# Patient Record
Sex: Female | Born: 1980 | Race: Black or African American | Hispanic: No | Marital: Single | Smoking: Never smoker
Health system: Southern US, Community
[De-identification: ages and names within clinical notes are randomized; demographics above are authoritative.]

## PROBLEM LIST (undated history)

## (undated) DIAGNOSIS — F32A Depression, unspecified: Secondary | ICD-10-CM

## (undated) DIAGNOSIS — F329 Major depressive disorder, single episode, unspecified: Secondary | ICD-10-CM

## (undated) DIAGNOSIS — F419 Anxiety disorder, unspecified: Secondary | ICD-10-CM

## (undated) HISTORY — DX: Depression, unspecified: F32.A

## (undated) HISTORY — DX: Anxiety disorder, unspecified: F41.9

## (undated) HISTORY — DX: Major depressive disorder, single episode, unspecified: F32.9

## (undated) HISTORY — PX: NO PAST SURGERIES: SHX2092

---

## 2011-01-28 ENCOUNTER — Encounter: Admit: 2011-01-28 | Payer: Self-pay | Admitting: Family Medicine

## 2014-05-09 ENCOUNTER — Ambulatory Visit (INDEPENDENT_AMBULATORY_CARE_PROVIDER_SITE_OTHER): Payer: BC Managed Care – PPO | Admitting: Physician Assistant

## 2014-05-09 VITALS — BP 124/78 | HR 81 | Temp 98.6°F | Resp 18 | Ht 65.0 in | Wt 238.0 lb

## 2014-05-09 DIAGNOSIS — B3731 Acute candidiasis of vulva and vagina: Secondary | ICD-10-CM

## 2014-05-09 DIAGNOSIS — B373 Candidiasis of vulva and vagina: Secondary | ICD-10-CM

## 2014-05-09 DIAGNOSIS — N926 Irregular menstruation, unspecified: Secondary | ICD-10-CM

## 2014-05-09 DIAGNOSIS — B9689 Other specified bacterial agents as the cause of diseases classified elsewhere: Secondary | ICD-10-CM

## 2014-05-09 DIAGNOSIS — F32A Depression, unspecified: Secondary | ICD-10-CM | POA: Insufficient documentation

## 2014-05-09 DIAGNOSIS — N898 Other specified noninflammatory disorders of vagina: Secondary | ICD-10-CM

## 2014-05-09 DIAGNOSIS — N925 Other specified irregular menstruation: Secondary | ICD-10-CM

## 2014-05-09 DIAGNOSIS — N949 Unspecified condition associated with female genital organs and menstrual cycle: Secondary | ICD-10-CM

## 2014-05-09 DIAGNOSIS — N760 Acute vaginitis: Secondary | ICD-10-CM

## 2014-05-09 DIAGNOSIS — F329 Major depressive disorder, single episode, unspecified: Secondary | ICD-10-CM | POA: Insufficient documentation

## 2014-05-09 DIAGNOSIS — L293 Anogenital pruritus, unspecified: Secondary | ICD-10-CM

## 2014-05-09 LAB — POCT URINALYSIS DIPSTICK
Bilirubin, UA: NEGATIVE
GLUCOSE UA: NEGATIVE
Ketones, UA: NEGATIVE
NITRITE UA: NEGATIVE
RBC UA: NEGATIVE
Spec Grav, UA: 1.015
UROBILINOGEN UA: 1
pH, UA: 7.5

## 2014-05-09 LAB — POCT WET PREP WITH KOH
Clue Cells Wet Prep HPF POC: 75
KOH Prep POC: POSITIVE
TRICHOMONAS UA: NEGATIVE
Yeast Wet Prep HPF POC: NEGATIVE

## 2014-05-09 LAB — POCT UA - MICROSCOPIC ONLY
CRYSTALS, UR, HPF, POC: NEGATIVE
Casts, Ur, LPF, POC: NEGATIVE
Yeast, UA: NEGATIVE

## 2014-05-09 LAB — POCT URINE PREGNANCY: Preg Test, Ur: NEGATIVE

## 2014-05-09 MED ORDER — METRONIDAZOLE 500 MG PO TABS: 500.0000 mg | ORAL_TABLET | Freq: Two times a day (BID) | ORAL | Status: AC

## 2014-05-09 MED ORDER — FLUCONAZOLE 150 MG PO TABS: 150.0000 mg | ORAL_TABLET | Freq: Once | ORAL | Status: DC

## 2014-05-09 NOTE — Progress Notes (Signed)
Subjective:    Patient ID: [name] [name], female    DOB: Jul 13, 1981, 33 y.o.   MRN: [id number]  HPI Pt presents to clinic with vaginal irritation and discharge. She is late on her menses but that is normal for her.  She is sexually active without condom use - she does not want pregnancy but is ok if it happens.  She is not concerned about STIs but would like to be tested while she is here.  She is having no urinary symptoms.  Review of Systems  Constitutional: Negative for fever and chills.  Genitourinary: Positive for vaginal discharge. Negative for dysuria, urgency and frequency.       Objective:   Physical Exam  Vitals reviewed. Constitutional: She is oriented to person, place, and time. She appears well-developed and well-nourished.  HENT:  Head: Normocephalic and atraumatic.  Right Ear: External ear normal.  Left Ear: External ear normal.  Neck: Normal range of motion.  Cardiovascular: Normal rate, regular rhythm and normal heart sounds.   No murmur heard. Pulmonary/Chest: Effort normal and breath sounds normal. She has no wheezes.  Abdominal: Soft. There is no tenderness.  Genitourinary: Uterus normal. Pelvic exam was performed with patient supine. There is no rash, tenderness, lesion or injury on the right labia. There is no rash, tenderness, lesion or injury on the left labia. Cervix exhibits no motion tenderness, no discharge and no friability. Right adnexum displays no mass, no tenderness and no fullness. Left adnexum displays no mass, no tenderness and no fullness. No erythema around the vagina. Vaginal discharge (thin white and clumpy white discharge) found.  Neurological: She is alert and oriented to person, place, and time.  Skin: Skin is warm and dry.  Psychiatric: She has a normal mood and affect. Her behavior is normal. Judgment and thought content normal.   Results for orders placed in visit on 05/09/14  POCT URINALYSIS DIPSTICK      Result Value Ref Range   Color, UA yellow     Clarity, UA slightly cloudy     Glucose, UA neg     Bilirubin, UA neg     Ketones, UA neg     Spec Grav, UA 1.015     Blood, UA neg     pH, UA 7.5     Protein, UA trace     Urobilinogen, UA 1.0     Nitrite, UA neg     Leukocytes, UA Trace    POCT UA - MICROSCOPIC ONLY      Result Value Ref Range   WBC, Ur, HPF, POC 1-2     RBC, urine, microscopic 0-2     Bacteria, U Microscopic 2+     Mucus, UA trace     Epithelial cells, urine per micros 4-11     Crystals, Ur, HPF, POC neg     Casts, Ur, LPF, POC neg     Yeast, UA neg    POCT WET PREP WITH KOH      Result Value Ref Range   Trichomonas, UA Negative     Clue Cells Wet Prep HPF POC 75%     Epithelial Wet Prep HPF POC 15-25     Yeast Wet Prep HPF POC neg     Bacteria Wet Prep HPF POC 1+     RBC Wet Prep HPF POC 0-1     WBC Wet Prep HPF POC 0-3     KOH Prep POC Positive    POCT URINE PREGNANCY  Result Value Ref Range   Preg Test, Ur Negative         Assessment & Plan:  Vaginal itching - Plan: POCT urinalysis dipstick, POCT UA - Microscopic Only, POCT Wet Prep with KOH, GC/Chlamydia Probe Amp  Late menses - Plan: POCT urine pregnancy  BV (bacterial vaginosis) - Plan: metroNIDAZOLE (FLAGYL) 500 MG tablet  Yeast vaginitis - Plan: fluconazole (DIFLUCAN) 150 MG tablet  Benny LennertSarah Weber PA-C  [facility] 05/09/2014 9:29 PM

## 2014-05-11 LAB — GC/CHLAMYDIA PROBE AMP
CT PROBE, AMP APTIMA: NEGATIVE
GC Probe RNA: NEGATIVE

## 2014-05-25 ENCOUNTER — Telehealth: Payer: Self-pay

## 2014-05-25 ENCOUNTER — Telehealth: Payer: Self-pay | Admitting: Physician Assistant

## 2014-05-25 DIAGNOSIS — B3731 Acute candidiasis of vulva and vagina: Secondary | ICD-10-CM

## 2014-05-25 DIAGNOSIS — B373 Candidiasis of vulva and vagina: Secondary | ICD-10-CM

## 2014-05-25 MED ORDER — FLUCONAZOLE 150 MG PO TABS: 150.0000 mg | ORAL_TABLET | Freq: Once | ORAL | Status: AC

## 2014-05-25 NOTE — Telephone Encounter (Signed)
I sent in a medication called Diflucan - that is most likely what is causing the itchng - if she is not better after this she needs to RTC.

## 2014-05-25 NOTE — Telephone Encounter (Signed)
This patient called the answering service last night about 11 pm and spoke with Dr. Cleta Alberts regarding recurrent vaginal itching.  Symptoms were present x 10 days.  Seen about 2 months ago and diagnosed with yeast and BV.  Dr. Cleta Alberts advised the patient that he would look in her record and contact her today.  There is no patient in our system with this name and DOB.  I contacted the answering service to verify the name spelling and DOB. I called the number Dr. Cleta Alberts called last night and left a message to return our call.

## 2014-05-25 NOTE — Telephone Encounter (Signed)
[name] Sago, do you want to give pt another round of medication or does she need to come back in for testing? It looks like you Rxd her Flagyl also.

## 2014-05-25 NOTE — Telephone Encounter (Signed)
Pt was here on the 12 th of this month and saw Benny Lennert for a yeast infection. She was prescribed fluconazole and valACYclovir, the infection seemed to go away, but is now back; she would like to be advised whether or not she should come back in for a visit or can be re-priscribed these medications. Please call  [phone]

## 2014-05-25 NOTE — Telephone Encounter (Signed)
Pt advised.

## 2014-05-26 NOTE — Telephone Encounter (Signed)
It appears that the name was incorrectly spelled. The patient was advised to RTC for re-evaluation earlier on the same day she called the answering service.

## 2014-07-31 LAB — OB RESULTS CONSOLE ABO/RH: RH TYPE: POSITIVE

## 2014-07-31 LAB — OB RESULTS CONSOLE RUBELLA ANTIBODY, IGM: RUBELLA: IMMUNE

## 2014-07-31 LAB — OB RESULTS CONSOLE HEPATITIS B SURFACE ANTIGEN: Hepatitis B Surface Ag: NEGATIVE

## 2014-07-31 LAB — OB RESULTS CONSOLE ANTIBODY SCREEN: Antibody Screen: NEGATIVE

## 2014-07-31 LAB — OB RESULTS CONSOLE GC/CHLAMYDIA
CHLAMYDIA, DNA PROBE: NEGATIVE
Gonorrhea: NEGATIVE

## 2014-07-31 LAB — OB RESULTS CONSOLE RPR: RPR: NONREACTIVE

## 2014-07-31 LAB — OB RESULTS CONSOLE HIV ANTIBODY (ROUTINE TESTING): HIV: NONREACTIVE

## 2014-08-01 ENCOUNTER — Other Ambulatory Visit: Payer: Self-pay | Admitting: Obstetrics and Gynecology

## 2014-08-01 ENCOUNTER — Other Ambulatory Visit (HOSPITAL_COMMUNITY)
Admission: RE | Admit: 2014-08-01 | Discharge: 2014-08-01 | Disposition: A | Discharge status: Home / Self Care | Payer: BC Managed Care – PPO | Source: Ambulatory Visit | Attending: Obstetrics and Gynecology | Admitting: Obstetrics and Gynecology

## 2014-08-01 DIAGNOSIS — Z1389 Encounter for screening for other disorder: Secondary | ICD-10-CM

## 2014-08-01 DIAGNOSIS — Z1151 Encounter for screening for human papillomavirus (HPV): Secondary | ICD-10-CM | POA: Insufficient documentation

## 2014-08-01 DIAGNOSIS — Z01419 Encounter for gynecological examination (general) (routine) without abnormal findings: Secondary | ICD-10-CM | POA: Insufficient documentation

## 2014-08-03 ENCOUNTER — Ambulatory Visit (HOSPITAL_COMMUNITY)
Admission: RE | Admit: 2014-08-03 | Discharge: 2014-08-03 | Disposition: A | Discharge status: Home / Self Care | Payer: BC Managed Care – PPO | Source: Ambulatory Visit | Attending: Obstetrics and Gynecology | Admitting: Obstetrics and Gynecology

## 2014-08-03 DIAGNOSIS — Z363 Encounter for antenatal screening for malformations: Secondary | ICD-10-CM | POA: Insufficient documentation

## 2014-08-03 DIAGNOSIS — Z1389 Encounter for screening for other disorder: Secondary | ICD-10-CM | POA: Insufficient documentation

## 2014-08-03 LAB — CYTOLOGY - PAP

## 2014-08-04 ENCOUNTER — Ambulatory Visit (HOSPITAL_COMMUNITY): Payer: Self-pay

## 2014-09-01 ENCOUNTER — Ambulatory Visit (INDEPENDENT_AMBULATORY_CARE_PROVIDER_SITE_OTHER): Payer: BC Managed Care – PPO | Admitting: Internal Medicine

## 2014-09-01 ENCOUNTER — Ambulatory Visit: Payer: BC Managed Care – PPO | Admitting: Internal Medicine

## 2014-09-01 ENCOUNTER — Encounter: Payer: Self-pay | Admitting: Internal Medicine

## 2014-09-01 VITALS — BP 116/62 | HR 85 | Temp 98.6°F | Ht 66.0 in | Wt 232.0 lb

## 2014-09-01 DIAGNOSIS — R7989 Other specified abnormal findings of blood chemistry: Secondary | ICD-10-CM | POA: Insufficient documentation

## 2014-09-01 DIAGNOSIS — R799 Abnormal finding of blood chemistry, unspecified: Secondary | ICD-10-CM

## 2014-09-01 NOTE — Progress Notes (Signed)
Patient ID: [name] [name], female   DOB: 18-Mar-1981, 33 y.o.   MRN: [id number]  HPI: [name] [name] is a 33 y.o. female, referred by Dr Dion Body, in consultation for high testosterone level. She is now [redacted] weeks pregnant.  Pt was referred after she presented to her ObGyn for pregnancy management. A total testosterone was high, 111 (8-48) at 15 weeks of her pregnancy. Of note, free testosterone was normal, at 1.6 (0-4.2).   The tesosterone was checked as she described had hirsutism and her cycles were irregular (every 2-3 months) - she initially did not realize she was pregnant 2/2 her previous irregular cycles.   She was also told she had an enlarged clitoris by Dr Dion Body at last visit. She tells me she has had this all her life - no recent changes. She had PAP smears in the past by PCP.   Weight gain: - her weight fluctuates w/in 10 lbs - no steroid use  Fertility/Menstrual cycles: - irregular menses  - no h/o ovarian cysts - menarche at 7 y/o - children: 0 - miscarriages: 0 - contraception: was on OCP in college >> came off late 69s >> cycles became irregular  Acne: - no  Hirsutism: - chin, upper lip, chest, stomach  - more lately  Other meds: - SSRIs: not during the pregnancy  - Last thyroid tests: normal  - during pregnancy - Last HbA1c: normal - during pregnancy  She has FH of infertility or PCOS. FH orf DM in grandparents.  ROS: Constitutional: + weight gain, + fatigue, no subjective hyperthermia/hypothermia, + poor sleep, + nocturia Eyes: no blurry vision, no xerophthalmia ENT: no sore throat, no nodules palpated in throat, no dysphagia/odynophagia, no hoarseness Cardiovascular: no CP/SOB/palpitations/leg swelling Respiratory: no cough/SOB Gastrointestinal: no N/V/D/+ C Musculoskeletal: no muscle/joint aches Skin: no acne, + hair on face, + acanthosis nigricans neck; + new thin stretch marks, + excessive hair growth (see HPI), + eczema Neurological: no  tremors/numbness/tingling/dizziness, + HA Psychiatric: no depression/anxiety  Past Medical History  Diagnosis Date  . Depression   . Anxiety    No past surgical history on file. History   Social History  . Marital Status: Single    Spouse Name: N/A    Number of Children: 0   Occupational History  . teacher   Social History Main Topics  . Smoking status: Never Smoker   . Smokeless tobacco: Never Used  . Alcohol Use: 0.5 - 1.0 oz/week    1-2 drink(s) per week  . Drug Use: No   Current Outpatient Prescriptions on File Prior to Visit  Medication Sig Dispense Refill  . valACYclovir (VALTREX) 500 MG tablet Take 500 mg by mouth daily as needed.      Marland Kitchen escitalopram (LEXAPRO) 20 MG tablet Take 20 mg by mouth daily.      . fluconazole (DIFLUCAN) 150 MG tablet Take 1 tablet (150 mg total) by mouth once. Repeat in 1 week is needed  2 tablet  0   No current facility-administered medications on file prior to visit.   No Known Allergies Family History  Problem Relation Age of Onset  . Hypertension Mother   . Hypertension Father   . Diabetes Maternal Grandmother   . Heart failure Maternal Grandmother   . Cancer Paternal Grandfather    PE: BP 116/62  Pulse 85  Temp(Src) 98.6 F (37 C) (Oral)  Ht  (1.676 m)  Wt 232 lb (105.235 kg)  BMI 37.46 kg/m2  SpO2 98% Wt Readings from  Last 3 Encounters:  09/01/14 232 lb (105.235 kg)  05/09/14 238 lb (107.956 kg)   Constitutional: overweight, in NAD, no full supraclavicular fat pads Eyes: PERRLA, EOMI, no exophthalmos ENT: moist mucous membranes, no thyromegaly, no cervical lymphadenopathy Cardiovascular: RRR, No MRG Respiratory: CTA B Gastrointestinal: abdomen soft, NT, ND, BS+ Musculoskeletal: no deformities, strength intact in all 4 Skin: moist, warm; no acne, + dark terminal hair on chin, + dark vellum on sideburns, + acanthosis nigricans, no purple, wide, stretch marks Neurological: no tremor with outstretched hands, DTR  normal in all 4  ASSESSMENT: 1. Elevated testosterone level  2. Irregular menses - prior to pregnancy  PLAN: 1. Elevated testosterone level - we discussed about the fact that her total testosterone was elevated while her free testosterone was normal >> this is not uncommon in pregnancy due to the increase SHBG production from the liver, stimulated by the increased estrogen >> the increased SHBG will bind more testosterone >> the testosterone production needs to be increased to maintain the free testosterone in the normal range.  - we will recheck today:   Total testosterone  Free testosterone  SHBG level  2. Irregular menses prior to pregnancy It is possible that pt had PCOS or [place]-CAH (non classical cortical adrenal hyperplasia) prior to pregnancy - but I explained that we cannot check hormonal levels now - she will need to return for this ~ 6 mo after her pregnancy (ideally not during b'feeding, also)  I had a long discussion with the patient about the fact that the PCOS is of sum of several conditions, including:  weight gain  insulin resistance (and therefore a higher risk of developing diabetes later in life)  acne  hirsutism  irregular menstrual cycles  decreased fertility. - We also discussed about the fact that the treatment is usually targeted to addressing the problem that concerns the patient the most: acne/hirsutism, weight gain, or fertility, but there is no single treatment for PCOS.  - The first-line therapy are oral contraceptives. If she is concerned with her weight, we can use metformin; if she is concerned about acne/hirsutism, we can add spironolactone; and if she is concerned about fertility, I could refer her to reproductive endocrinology for possible use of clomiphene.  We also discussed that if she turns out to have [place]-CAH, this is usually managed like PCOS (with the exception of fertility management). We cannot check 17 hydroxy progesterone during  pregnancy to investigate her for this condition, but we can do this when she comes back after the pregnancy. A high teststosterone level, an enlarged clitoris and hirsutism can be seen in this condition, too.  - time spent with the patient: 1 hour, of which >50% was spent in obtaining information about her symptoms/signs, reviewing her previous labs, evaluations, counseling her about her condition (please see the discussed topics above), and developing a plan to further investigate it;  she had a number of questions which I addressed.   Office Visit on 09/01/2014  Component Date Value Ref Range Status  . Testosterone 09/01/2014 134* 10 - 70 ng/dL Final   Comment:           Michiel Sites       Female              Female  I              < 30 ng/dL        < 10 ng/dL                                        II             < 150 ng/dL       < 30 ng/dL                                        III            100-320 ng/dL     < 35 ng/dL                                        IV             200-970 ng/dL     19-14 ng/dL                                        V/Adult        300-890 ng/dL     78-29 ng/dL                             . Sex Hormone Binding 09/01/2014 162* 18 - 114 nmol/L Final  . Testosterone, Free 09/01/2014 7.4* 0.6 - 6.8 pg/mL Final   Comment:                            The concentration of free testosterone is derived from a mathematical                          expression based on constants for the binding of testosterone to sex                          hormone-binding globulin and albumin.  . Testosterone-% Free 09/01/2014 0.6  0.4 - 2.4 % Final   The SHBG is high >> a very high total testosterone, however, the free testosterone is only a little high, not worrisome, which is consistent with a prior dx of PCOS (most likely, however, we will further investigate this after the pregnancy, as mentioned above).

## 2014-09-01 NOTE — Patient Instructions (Signed)
Please stop at the lab downstairs [facility] lab). Please come back ~ 6 months after the pregnancy for further checking.  Polycystic Ovarian Syndrome Polycystic ovarian syndrome (PCOS) is a common hormonal disorder among women of reproductive age. Most women with PCOS grow many small cysts on their ovaries. PCOS can cause problems with your periods and make it difficult to get pregnant. It can also cause an increased risk of miscarriage with pregnancy. If left untreated, PCOS can lead to serious health problems, such as diabetes and heart disease. CAUSES The cause of PCOS is not fully understood, but genetics may be a factor. SIGNS AND SYMPTOMS   Infrequent or no menstrual periods.   Inability to get pregnant (infertility) because of not ovulating.   Increased growth of hair on the face, chest, stomach, back, thumbs, thighs, or toes.   Acne, oily skin, or dandruff.   Pelvic pain.   Weight gain or obesity, usually carrying extra weight around the waist.   Type 2 diabetes.   High cholesterol.   High blood pressure.   Female-pattern baldness or thinning hair.   Patches of thickened and dark brown or black skin on the neck, arms, breasts, or thighs.   Tiny excess flaps of skin (skin tags) in the armpits or neck area.   Excessive snoring and having breathing stop at times while asleep (sleep apnea).   Deepening of the voice.   Gestational diabetes when pregnant.  DIAGNOSIS  There is no single test to diagnose PCOS.   Your health care provider will:   Take a medical history.   Perform a pelvic exam.   Have ultrasonography done.   Check your female and female hormone levels.   Measure glucose or sugar levels in the blood.   Do other blood tests.   If you are producing too many female hormones, your health care provider will make sure it is from PCOS. At the physical exam, your health care provider will want to evaluate the areas of increased hair  growth. Try to allow natural hair growth for a few days before the visit.   During a pelvic exam, the ovaries may be enlarged or swollen because of the increased number of small cysts. This can be seen more easily by using vaginal ultrasonography or screening to examine the ovaries and lining of the uterus (endometrium) for cysts. The uterine lining may become thicker if you have not been having a regular period.  TREATMENT  Because there is no cure for PCOS, it needs to be managed to prevent problems. Treatments are based on your symptoms. Treatment is also based on whether you want to have a baby or whether you need contraception.  Treatment may include:   Progesterone hormone to start a menstrual period.   Birth control pills to make you have regular menstrual periods.   Medicines to make you ovulate, if you want to get pregnant.   Medicines to control your insulin.   Medicine to control your blood pressure.   Medicine and diet to control your high cholesterol and triglycerides in your blood.  Medicine to reduce excessive hair growth.  Surgery, making small holes in the ovary, to decrease the amount of female hormone production. This is done through a long, lighted tube (laparoscope) placed into the pelvis through a tiny incision in the lower abdomen.  HOME CARE INSTRUCTIONS  Only take over-the-counter or prescription medicine as directed by your health care provider.  Pay attention to the foods you eat and your  activity levels. This can help reduce the effects of PCOS.  Keep your weight under control.  Eat foods that are low in carbohydrate and high in fiber.  Exercise regularly. SEEK MEDICAL CARE IF:  Your symptoms do not get better with medicine.  You have new symptoms. Document Released: 04/10/2005 Document Revised: 10/05/2013 Document Reviewed: 06/02/2013 [facility] Patient Information 2015 [place], [place]. This information is not intended to replace advice given  to you by your health care provider. Make sure you discuss any questions you have with your health care provider.

## 2014-09-05 LAB — TESTOSTERONE, FREE, TOTAL, SHBG
Sex Hormone Binding: 162 nmol/L — ABNORMAL HIGH (ref 18–114)
TESTOSTERONE FREE: 7.4 pg/mL — AB (ref 0.6–6.8)
Testosterone-% Free: 0.6 % (ref 0.4–2.4)
Testosterone: 134 ng/dL — ABNORMAL HIGH (ref 10–70)

## 2014-09-06 ENCOUNTER — Encounter: Payer: Self-pay | Admitting: *Deleted

## 2014-12-27 LAB — OB RESULTS CONSOLE GBS: STREP GROUP B AG: NEGATIVE

## 2015-01-25 ENCOUNTER — Encounter (HOSPITAL_COMMUNITY): Payer: Self-pay | Admitting: *Deleted

## 2015-01-25 ENCOUNTER — Inpatient Hospital Stay (HOSPITAL_COMMUNITY)
Admission: AD | Admit: 2015-01-25 | Discharge: 2015-01-30 | DRG: 765 | Disposition: A | Discharge status: Home / Self Care | Payer: BC Managed Care – PPO | Source: Ambulatory Visit | Attending: Obstetrics and Gynecology | Admitting: Obstetrics and Gynecology

## 2015-01-25 DIAGNOSIS — Z6841 Body Mass Index (BMI) 40.0 and over, adult: Secondary | ICD-10-CM

## 2015-01-25 DIAGNOSIS — O99344 Other mental disorders complicating childbirth: Secondary | ICD-10-CM | POA: Diagnosis present

## 2015-01-25 DIAGNOSIS — F329 Major depressive disorder, single episode, unspecified: Secondary | ICD-10-CM | POA: Diagnosis present

## 2015-01-25 DIAGNOSIS — O99214 Obesity complicating childbirth: Secondary | ICD-10-CM | POA: Diagnosis present

## 2015-01-25 DIAGNOSIS — O42113 Preterm premature rupture of membranes, onset of labor more than 24 hours following rupture, third trimester: Secondary | ICD-10-CM | POA: Diagnosis present

## 2015-01-25 DIAGNOSIS — Z3A40 40 weeks gestation of pregnancy: Secondary | ICD-10-CM | POA: Diagnosis present

## 2015-01-25 LAB — POCT FERN TEST: POCT FERN TEST: POSITIVE

## 2015-01-25 LAB — CBC
HEMATOCRIT: 35.9 % — AB (ref 36.0–46.0)
Hemoglobin: 12.4 g/dL (ref 12.0–15.0)
MCH: 32 pg (ref 26.0–34.0)
MCHC: 34.5 g/dL (ref 30.0–36.0)
MCV: 92.5 fL (ref 78.0–100.0)
PLATELETS: 169 10*3/uL (ref 150–400)
RBC: 3.88 MIL/uL (ref 3.87–5.11)
RDW: 13.9 % (ref 11.5–15.5)
WBC: 6.5 10*3/uL (ref 4.0–10.5)

## 2015-01-25 MED ORDER — OXYCODONE-ACETAMINOPHEN 5-325 MG PO TABS: 2.0000 | ORAL_TABLET | ORAL | Status: DC | PRN

## 2015-01-25 MED ORDER — TERBUTALINE SULFATE 1 MG/ML IJ SOLN: 0.2500 mg | Freq: Once | INTRAMUSCULAR | Status: AC | PRN

## 2015-01-25 MED ORDER — CITRIC ACID-SODIUM CITRATE 334-500 MG/5ML PO SOLN
30.0000 mL | ORAL | Status: DC | PRN
  Administered 2015-01-27: 30 mL via ORAL
  Filled 2015-01-25: qty 15

## 2015-01-25 MED ORDER — LIDOCAINE HCL (PF) 1 % IJ SOLN: 30.0000 mL | INTRAMUSCULAR | Status: DC | PRN

## 2015-01-25 MED ORDER — BUTORPHANOL TARTRATE 1 MG/ML IJ SOLN
1.0000 mg | INTRAMUSCULAR | Status: DC | PRN
  Administered 2015-01-26 (×2): 1 mg via INTRAVENOUS
  Filled 2015-01-25 (×2): qty 1

## 2015-01-25 MED ORDER — ACETAMINOPHEN 325 MG PO TABS: 650.0000 mg | ORAL_TABLET | ORAL | Status: DC | PRN

## 2015-01-25 MED ORDER — LACTATED RINGERS IV SOLN
500.0000 mL | INTRAVENOUS | Status: DC | PRN
Start: 2015-01-25 — End: 2015-01-27
  Administered 2015-01-26 – 2015-01-27 (×3): 500 mL via INTRAVENOUS

## 2015-01-25 MED ORDER — ONDANSETRON HCL 4 MG/2ML IJ SOLN: 4.0000 mg | Freq: Four times a day (QID) | INTRAMUSCULAR | Status: DC | PRN

## 2015-01-25 MED ORDER — OXYCODONE-ACETAMINOPHEN 5-325 MG PO TABS: 1.0000 | ORAL_TABLET | ORAL | Status: DC | PRN

## 2015-01-25 MED ORDER — OXYTOCIN BOLUS FROM INFUSION: 500.0000 mL | INTRAVENOUS | Status: DC

## 2015-01-25 MED ORDER — OXYTOCIN 40 UNITS IN LACTATED RINGERS INFUSION - SIMPLE MED
62.5000 mL/h | INTRAVENOUS | Status: DC
  Filled 2015-01-25: qty 1000

## 2015-01-25 MED ORDER — FLEET ENEMA 7-19 GM/118ML RE ENEM: 1.0000 | ENEMA | RECTAL | Status: DC | PRN

## 2015-01-25 MED ORDER — OXYTOCIN 40 UNITS IN LACTATED RINGERS INFUSION - SIMPLE MED
1.0000 m[IU]/min | INTRAVENOUS | Status: DC
  Administered 2015-01-26: 2 m[IU]/min via INTRAVENOUS

## 2015-01-25 MED ORDER — LACTATED RINGERS IV SOLN
INTRAVENOUS | Status: DC
  Administered 2015-01-25 – 2015-01-26 (×2): via INTRAVENOUS
  Administered 2015-01-26: 125 mL/h via INTRAVENOUS
  Administered 2015-01-27: 07:00:00 via INTRAVENOUS

## 2015-01-25 NOTE — MAU Note (Signed)
PT  SAYS SHE NOTICED  WETNESS  TODAY  AT  6, 8, 9 PM-    NO GUSH.   GETS  P[facility]  - WITH DR  VARNADO-  TOLD  TO COME  IN .  HAS HX  OF  HSV-  LAST OUTBREAK-- 10-2014.  DENIES  OUTBREAK NOW-  TAKING  VALTREX..   VE IN OFFICE  TODAY-   1CM,

## 2015-01-25 NOTE — MAU Provider Note (Signed)
S: [name] [name] is a 34 y.o. G1P0 at 6574w5d who presents today with leaking of fluid. She denies any VB. She confirms fetal movement. O: VSS, afebrile Abdomen: soft, non-tender, gravid External: no lesion Vagina: small amount of yellow transparent, thick (jelly-like) discharge on perineum.   More transparent thick discharge seen in vagina.    Sample collected to look for ferning.   Cervix: pink, smooth Uterus: AGA  Fern test: positive.    RN will report to attending MD

## 2015-01-26 ENCOUNTER — Inpatient Hospital Stay (HOSPITAL_COMMUNITY): Payer: BC Managed Care – PPO | Admitting: Anesthesiology

## 2015-01-26 ENCOUNTER — Encounter (HOSPITAL_COMMUNITY): Payer: Self-pay | Admitting: Anesthesiology

## 2015-01-26 LAB — TYPE AND SCREEN
ABO/RH(D): O POS
Antibody Screen: NEGATIVE

## 2015-01-26 LAB — ABO/RH: ABO/RH(D): O POS

## 2015-01-26 MED ORDER — LACTATED RINGERS IV SOLN
INTRAVENOUS | Status: DC
  Administered 2015-01-26: 150 mL via INTRAUTERINE
  Administered 2015-01-27 (×2): via INTRAUTERINE

## 2015-01-26 MED ORDER — MISOPROSTOL 25 MCG QUARTER TABLET
50.0000 ug | ORAL_TABLET | Freq: Once | ORAL | Status: AC
  Administered 2015-01-26: 50 ug via ORAL
  Filled 2015-01-26: qty 0.5

## 2015-01-26 MED ORDER — LIDOCAINE HCL (PF) 1 % IJ SOLN
INTRAMUSCULAR | Status: DC | PRN
  Administered 2015-01-26 (×2): 4 mL

## 2015-01-26 MED ORDER — EPHEDRINE 5 MG/ML INJ: 10.0000 mg | INTRAVENOUS | Status: DC | PRN

## 2015-01-26 MED ORDER — SODIUM CHLORIDE 0.9 % IV SOLN
3.0000 g | Freq: Four times a day (QID) | INTRAVENOUS | Status: DC
  Administered 2015-01-26 – 2015-01-27 (×4): 3 g via INTRAVENOUS
  Filled 2015-01-26 (×6): qty 3

## 2015-01-26 MED ORDER — FENTANYL 2.5 MCG/ML BUPIVACAINE 1/10 % EPIDURAL INFUSION (WH - ANES)
14.0000 mL/h | INTRAMUSCULAR | Status: DC | PRN
  Administered 2015-01-26 – 2015-01-27 (×3): 14 mL/h via EPIDURAL
  Filled 2015-01-26 (×3): qty 125

## 2015-01-26 MED ORDER — PHENYLEPHRINE 40 MCG/ML (10ML) SYRINGE FOR IV PUSH (FOR BLOOD PRESSURE SUPPORT)
80.0000 ug | PREFILLED_SYRINGE | INTRAVENOUS | Status: DC | PRN
  Filled 2015-01-26: qty 20

## 2015-01-26 MED ORDER — PHENYLEPHRINE 40 MCG/ML (10ML) SYRINGE FOR IV PUSH (FOR BLOOD PRESSURE SUPPORT)
80.0000 ug | PREFILLED_SYRINGE | INTRAVENOUS | Status: DC | PRN
Start: 2015-01-26 — End: 2015-01-27

## 2015-01-26 MED ORDER — DIPHENHYDRAMINE HCL 50 MG/ML IJ SOLN: 12.5000 mg | INTRAMUSCULAR | Status: DC | PRN

## 2015-01-26 MED ORDER — LACTATED RINGERS IV SOLN
500.0000 mL | Freq: Once | INTRAVENOUS | Status: AC
  Administered 2015-01-26: 500 mL via INTRAVENOUS

## 2015-01-26 MED ORDER — FENTANYL 2.5 MCG/ML BUPIVACAINE 1/10 % EPIDURAL INFUSION (WH - ANES)
INTRAMUSCULAR | Status: DC | PRN
  Administered 2015-01-26: 14 mL/h via EPIDURAL

## 2015-01-26 NOTE — Progress Notes (Signed)
[name] [name] is a 34 y.o. G1P0 at 3974w6d  admitted for rupture of membranes  Subjective: Per nurse patient with thick meconium and variable decels that have now resolved... Patient is comfortable with her epidural   Objective: BP 119/75 mmHg  Pulse 87  Temp(Src) 98 F (36.7 C) (Oral)  Resp 18  Ht 5\' 5"  (1.651 m)  Wt 112.038 kg (247 lb)  BMI 41.10 kg/m2  SpO2 100%  LMP 03/30/2014   Total I/O In: -  Out: 1200 [Urine:1200]  FHT:  FHR: 130 bpm, variability: moderate,  accelerations:  Present,  decelerations:  Absent UC:   regular, every 3 minutes  IUPC placed  SVE:   Dilation: 3.5 Effacement (%): 80 Station: -1 Exam by:: Dr Richardson Doppole  Labs: Lab Results  Component Value Date   WBC 6.5 01/25/2015   HGB 12.4 01/25/2015   HCT 35.9* 01/25/2015   MCV 92.5 01/25/2015   PLT 169 01/25/2015    Assessment / Plan: 40 wks and 6 days with prom Continue pitocin for augmentation of labor  Unasyn for prolonged rupture.  Amnioinfusion given variables and thick meconium    [name] Pequignot J. 01/26/2015, 4:10 PM

## 2015-01-26 NOTE — Anesthesia Procedure Notes (Addendum)
Epidural Patient location during procedure: OB Start time: 01/26/2015 7:43 AM  Staffing Anesthesiologist: Travelle Mcclimans A. Performed by: anesthesiologist   Preanesthetic Checklist Completed: patient identified, site marked, surgical consent, pre-op evaluation, timeout performed, IV checked, risks and benefits discussed and monitors and equipment checked  Epidural Patient position: sitting Prep: site prepped and draped and DuraPrep Patient monitoring: continuous pulse ox and blood pressure Approach: midline Location: L3-L4 Injection technique: LOR air  Needle:  Needle type: Tuohy  Needle gauge: 17 G Needle length: 9 cm and 9 Needle insertion depth: 9 cm Catheter type: closed end flexible Catheter size: 19 Gauge Catheter at skin depth: 14 cm Test dose: negative and Other  Assessment Events: blood not aspirated, injection not painful, no injection resistance, negative IV test and no paresthesia  Additional Notes Patient identified. Risks and benefits discussed including failed block, incomplete  Pain control, post dural puncture headache, nerve damage, paralysis, blood pressure Changes, nausea, vomiting, reactions to medications-both toxic and allergic and post Partum back pain. All questions were answered. Patient expressed understanding and wished to proceed. Sterile technique was used throughout procedure. Epidural site was Dressed with sterile barrier dressing. No paresthesias, signs of intravascular injection Or signs of intrathecal spread were encountered.  Patient was more comfortable after the epidural was dosed. Please see RN's note for documentation of vital signs and FHR which are stable.

## 2015-01-26 NOTE — Anesthesia Preprocedure Evaluation (Signed)
Anesthesia Evaluation  Patient identified by MRN, date of birth, ID band Patient awake    Reviewed: Allergy & Precautions, NPO status , Patient's Chart, lab work & pertinent test results  Airway Mallampati: III  TM Distance: >3 FB Neck ROM: Full    Dental no notable dental hx. (+) Teeth Intact   Pulmonary neg pulmonary ROS,  breath sounds clear to auscultation  Pulmonary exam normal       Cardiovascular negative cardio ROS  Rhythm:Regular Rate:Normal     Neuro/Psych PSYCHIATRIC DISORDERS Anxiety Depression negative neurological ROS     GI/Hepatic negative GI ROS, Neg liver ROS,   Endo/Other  Morbid obesity  Renal/GU negative Renal ROS     Musculoskeletal negative musculoskeletal ROS (+)   Abdominal (+) + obese,   Peds  Hematology negative hematology ROS (+)   Anesthesia Other Findings   Reproductive/Obstetrics (+) Pregnancy HSV                             Anesthesia Physical Anesthesia Plan  ASA: III  Anesthesia Plan: Epidural   Post-op Pain Management:    Induction:   Airway Management Planned: Natural Airway  Additional Equipment:   Intra-op Plan:   Post-operative Plan:   Informed Consent: I have reviewed the patients History and Physical, chart, labs and discussed the procedure including the risks, benefits and alternatives for the proposed anesthesia with the patient or authorized representative who has indicated his/her understanding and acceptance.     Plan Discussed with: Anesthesiologist  Anesthesia Plan Comments:         Anesthesia Quick Evaluation

## 2015-01-26 NOTE — Progress Notes (Signed)
SCDS placed.

## 2015-01-26 NOTE — Progress Notes (Signed)
Loucinda Christell ConstantMoore is a 34 y.o. G1P0 at 9785w6d  admitted for rupture of membranes  Subjective:  patient resting comfortably with epidural    Objective: BP 104/52 mmHg  Pulse 84  Temp(Src) 98.4 F (36.9 C) (Oral)  Resp 18  Ht 5\' 5"  (1.651 m)  Wt 112.038 kg (247 lb)  BMI 41.10 kg/m2  SpO2 100%  LMP 03/30/2014      FHT:  FHR: 130 bpm, variability: moderate,  accelerations:  Present,  decelerations:  Absent UC:   irregular, every 3-6 minutes SVE:   Dilation: 2 Effacement (%): 80 Station: -2 Exam by:: Khamil Lamica  Labs: Lab Results  Component Value Date   WBC 6.5 01/25/2015   HGB 12.4 01/25/2015   HCT 35.9* 01/25/2015   MCV 92.5 01/25/2015   PLT 169 01/25/2015    Assessment / Plan: 40 wks 6 days with prom.. continue pitocin...  Unasyn for prolonged rupture   Tevan Marian J. 01/26/2015, 1:55 PM

## 2015-01-26 NOTE — Progress Notes (Signed)
[name] [name] is a [age] y.o. G1P0 at 364w6d  admitted for rupture of membranes  Subjective: Patient just received an epidural as " my pain tolerance is low"  She no longer feels her contractions.  Continued leakage of clear fluid no bleeding  Last hsv outbreak in December    Objective: BP 123/69 mmHg  Pulse 89  Temp(Src) 98.2 F (36.8 C) (Oral)  Resp 18  Ht 5\' 5"  (1.651 m)  Wt 112.038 kg (247 lb)  BMI 41.10 kg/m2  SpO2 100%  LMP 03/30/2014      FHT:  FHR: 120 bpm, variability: moderate,  accelerations:  Present,  decelerations:  Absent and   UC:   Irregular pt currently on 14 mu of pitocin  SVE:   Visually 1 cm ,.. Digital exam deferred  Labs: Lab Results  Component Value Date   WBC 6.5 01/25/2015   HGB 12.4 01/25/2015   HCT 35.9* 01/25/2015   MCV 92.5 01/25/2015   PLT 169 01/25/2015    Assessment / Plan: 40 wks 6 days with SROM   Labor: Continue pitocin  Preeclampsia:  NA Fetal Wellbeing:  Category I Pain Control:  Epidural I/D:  plan unasyn 18 hours after rupture  Anticipated MOD:  NSVD  Meldon Hanzlik J. 01/26/2015, 8:02 AM

## 2015-01-26 NOTE — H&P (Signed)
[name] [name] is a 34 y.o. female G1 at 78 6/7 weeks dated by a 12 week ultrasound presenting for c/o LOF since 1800. Pt seen in the office ~ yesterday morning.  Pt reported contractions started overnight.  No LOF or VB at that time.  Cervical exam at ~11 am was 1/60/-2, previously closed.  NST reactive and irregular contractions.  Pt was comfortable. Overnight pt reported LOF that was minimal but it persisted.  Upon arrival, Cervix was 1/80, perineum dry.  Fern positive by speculum exam.  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  SSE negative in MAU.  Pt denies prodromal symptoms.  PNC since 16 weeks with [facility] [name] [name]) complicated by elevated testosterone.  Clitoromegaly on initial exam.  Testosterone was slightly elevated.  Endocrinology has evaluated pt and no intervention recommended.  Maternal Medical History:  Reason for admission: Rupture of membranes.   Contractions: Onset was 13-24 hours ago.   Frequency: irregular.   Perceived severity is mild.    Fetal activity: Perceived fetal activity is normal.    Prenatal complications: Elevated testosterone.  Prenatal Complications - Diabetes: none.    OB History    Gravida Para Term Preterm AB TAB SAB Ectopic Multiple Living   1              Past Medical History  Diagnosis Date  . Depression   . Anxiety    Past Surgical History  Procedure Laterality Date  . No past surgeries     Family History: family history includes Cancer in her paternal grandfather; Diabetes in her maternal grandmother; Heart failure in her maternal grandmother; Hypertension in her father and mother. Social History:  reports that she has never smoked. She has never used smokeless tobacco. She reports that she does not drink alcohol or use illicit drugs.   Prenatal Transfer Tool  Maternal Diabetes: No Genetic Screening: Normal Maternal Ultrasounds/Referrals: Normal Fetal Ultrasounds or other Referrals:  None Maternal Substance Abuse:   No Significant Maternal Medications:  Meds include: Other: Valtrex Significant Maternal Lab Results:  Lab values include: Group B Strep negative Other Comments:  Mother's testosterone slightly elevated.  S/p Endocrine evaluation.  Review of Systems  Gastrointestinal: Positive for abdominal pain.  Genitourinary:       +LOF, yellow mucoid discharge    Dilation: 1 (visually with speculum) Effacement (%): 80 Station: -2 Exam by:: [name] [name] Blood pressure 122/65, pulse 90, temperature 98.2 F (36.8 C), temperature source Oral, resp. rate 18, height  (1.651 m), weight 112.038 kg (247 lb), last menstrual period 03/30/2014, SpO2 100 %. Maternal Exam:  Uterine Assessment: Contraction frequency is irregular.   Abdomen: Patient reports no abdominal tenderness. Estimated fetal weight is [place] at 37 5/7 wks 6 lbs 10 oz (52%).   Fetal presentation: vertex  Introitus: Normal vulva. Vulva is negative for lesion.  Ferning test: positive.   Pelvis: adequate for delivery.   Cervix: Cervix evaluated by digital exam.     Fetal Exam Fetal Monitor Review: Mode: fetoscope.   Pattern: variable decelerations.    Fetal State Assessment: Category I - tracings are normal.    Tocometry:  Contractions rare.  Physical Exam  Constitutional: She is oriented to person, place, and time. She appears well-developed and well-nourished.  HENT:  Head: Normocephalic and atraumatic.  Neck: Normal range of motion.  Respiratory: No respiratory distress.  Genitourinary: Vulva exhibits no lesion.  Musculoskeletal: Normal range of motion. She exhibits no edema.  Neurological: She is alert and  oriented to person, place, and time.  Skin: Skin is warm and dry.    Prenatal labs: ABO, Rh: --/--/O POS, O POS (01/28 2345) Antibody: NEG (01/28 2345) Rubella:   RPR: Nonreactive (08/03 0000)  HBsAg:    HIV: Non-reactive (08/03 0000)  GBS: Negative (12/30 0000)   Assessment/Plan: IUP at 40 6/7 weeks PROM,  +Fern H/o HSV on Valtrex-No active lesions. Obesity H/o elevated testosterone.  Admitted to L&D. IOL with Pitocin. Stadol, Epidural prn.   [name] [name] 01/26/2015, 8:03 AM

## 2015-01-27 ENCOUNTER — Inpatient Hospital Stay (HOSPITAL_COMMUNITY): Payer: BC Managed Care – PPO

## 2015-01-27 ENCOUNTER — Encounter (HOSPITAL_COMMUNITY): Payer: Self-pay | Admitting: *Deleted

## 2015-01-27 ENCOUNTER — Encounter (HOSPITAL_COMMUNITY)
Admission: AD | Disposition: A | Discharge status: Home / Self Care | Payer: Self-pay | Source: Ambulatory Visit | Attending: Obstetrics and Gynecology

## 2015-01-27 LAB — RPR: RPR Ser Ql: NONREACTIVE

## 2015-01-27 SURGERY — Surgical Case
Anesthesia: Epidural | Site: Abdomen

## 2015-01-27 MED ORDER — LANOLIN HYDROUS EX OINT: 1.0000 "application " | TOPICAL_OINTMENT | CUTANEOUS | Status: DC | PRN

## 2015-01-27 MED ORDER — HYDROMORPHONE HCL 1 MG/ML IJ SOLN
1.0000 mg | Freq: Once | INTRAMUSCULAR | Status: AC
  Administered 2015-01-27: 1 mg via INTRAVENOUS
  Filled 2015-01-27: qty 1

## 2015-01-27 MED ORDER — ACETAMINOPHEN 500 MG PO TABS
1000.0000 mg | ORAL_TABLET | Freq: Four times a day (QID) | ORAL | Status: AC
  Administered 2015-01-27 – 2015-01-28 (×2): 1000 mg via ORAL
  Filled 2015-01-27 (×3): qty 2

## 2015-01-27 MED ORDER — SIMETHICONE 80 MG PO CHEW
80.0000 mg | CHEWABLE_TABLET | Freq: Three times a day (TID) | ORAL | Status: DC
  Administered 2015-01-28 – 2015-01-30 (×7): 80 mg via ORAL
  Filled 2015-01-27 (×8): qty 1

## 2015-01-27 MED ORDER — ONDANSETRON HCL 4 MG/2ML IJ SOLN
4.0000 mg | INTRAMUSCULAR | Status: DC | PRN
  Administered 2015-01-27: 4 mg via INTRAVENOUS
  Filled 2015-01-27: qty 2

## 2015-01-27 MED ORDER — FENTANYL CITRATE 0.05 MG/ML IJ SOLN
INTRAMUSCULAR | Status: AC
  Filled 2015-01-27: qty 2

## 2015-01-27 MED ORDER — OXYTOCIN 40 UNITS IN LACTATED RINGERS INFUSION - SIMPLE MED: 62.5000 mL/h | INTRAVENOUS | Status: AC

## 2015-01-27 MED ORDER — MORPHINE SULFATE (PF) 0.5 MG/ML IJ SOLN
INTRAMUSCULAR | Status: DC | PRN
  Administered 2015-01-27: 3 ug via EPIDURAL
  Administered 2015-01-27: 2 ug via INTRAVENOUS

## 2015-01-27 MED ORDER — ONDANSETRON HCL 4 MG PO TABS: 4.0000 mg | ORAL_TABLET | ORAL | Status: DC | PRN

## 2015-01-27 MED ORDER — SIMETHICONE 80 MG PO CHEW
80.0000 mg | CHEWABLE_TABLET | ORAL | Status: DC
Start: 2015-01-28 — End: 2015-01-30
  Administered 2015-01-27 – 2015-01-30 (×3): 80 mg via ORAL
  Filled 2015-01-27 (×3): qty 1

## 2015-01-27 MED ORDER — NALBUPHINE HCL 10 MG/ML IJ SOLN: 5.0000 mg | Freq: Once | INTRAMUSCULAR | Status: AC | PRN

## 2015-01-27 MED ORDER — NALBUPHINE HCL 10 MG/ML IJ SOLN: 5.0000 mg | INTRAMUSCULAR | Status: DC | PRN

## 2015-01-27 MED ORDER — SODIUM BICARBONATE 8.4 % IV SOLN
INTRAVENOUS | Status: DC | PRN
  Administered 2015-01-27 (×3): 5 mL via EPIDURAL

## 2015-01-27 MED ORDER — ONDANSETRON HCL 4 MG/2ML IJ SOLN: 4.0000 mg | Freq: Three times a day (TID) | INTRAMUSCULAR | Status: DC | PRN

## 2015-01-27 MED ORDER — NALOXONE HCL 0.4 MG/ML IJ SOLN: 0.4000 mg | INTRAMUSCULAR | Status: DC | PRN

## 2015-01-27 MED ORDER — FENTANYL CITRATE 0.05 MG/ML IJ SOLN
25.0000 ug | INTRAMUSCULAR | Status: DC | PRN
  Administered 2015-01-27 (×2): 50 ug via INTRAVENOUS

## 2015-01-27 MED ORDER — DIBUCAINE 1 % RE OINT: 1.0000 "application " | TOPICAL_OINTMENT | RECTAL | Status: DC | PRN

## 2015-01-27 MED ORDER — KETOROLAC TROMETHAMINE 30 MG/ML IJ SOLN
30.0000 mg | Freq: Four times a day (QID) | INTRAMUSCULAR | Status: DC | PRN
  Administered 2015-01-27: 30 mg via INTRAVENOUS

## 2015-01-27 MED ORDER — LIDOCAINE-EPINEPHRINE (PF) 2 %-1:200000 IJ SOLN
INTRAMUSCULAR | Status: AC
  Filled 2015-01-27: qty 20

## 2015-01-27 MED ORDER — DIPHENHYDRAMINE HCL 25 MG PO CAPS: 25.0000 mg | ORAL_CAPSULE | Freq: Four times a day (QID) | ORAL | Status: DC | PRN

## 2015-01-27 MED ORDER — WITCH HAZEL-GLYCERIN EX PADS: 1.0000 "application " | MEDICATED_PAD | CUTANEOUS | Status: DC | PRN

## 2015-01-27 MED ORDER — KETOROLAC TROMETHAMINE 30 MG/ML IJ SOLN: 30.0000 mg | Freq: Four times a day (QID) | INTRAMUSCULAR | Status: DC | PRN

## 2015-01-27 MED ORDER — PRENATAL MULTIVITAMIN CH
1.0000 | ORAL_TABLET | Freq: Every day | ORAL | Status: DC
  Administered 2015-01-28 – 2015-01-29 (×2): 1 via ORAL
  Filled 2015-01-27 (×2): qty 1

## 2015-01-27 MED ORDER — FENTANYL CITRATE 0.05 MG/ML IJ SOLN
INTRAMUSCULAR | Status: DC | PRN
  Administered 2015-01-27 (×2): 50 ug via INTRAVENOUS

## 2015-01-27 MED ORDER — FENTANYL CITRATE 0.05 MG/ML IJ SOLN
INTRAMUSCULAR | Status: AC
  Administered 2015-01-27: 50 ug via INTRAVENOUS
  Filled 2015-01-27: qty 2

## 2015-01-27 MED ORDER — 0.9 % SODIUM CHLORIDE (POUR BTL) OPTIME
TOPICAL | Status: DC | PRN
  Administered 2015-01-27: 1000 mL

## 2015-01-27 MED ORDER — SIMETHICONE 80 MG PO CHEW
80.0000 mg | CHEWABLE_TABLET | ORAL | Status: DC | PRN
Start: 2015-01-27 — End: 2015-01-30

## 2015-01-27 MED ORDER — MEPERIDINE HCL 25 MG/ML IJ SOLN: 6.2500 mg | INTRAMUSCULAR | Status: DC | PRN

## 2015-01-27 MED ORDER — DIPHENHYDRAMINE HCL 25 MG PO CAPS: 25.0000 mg | ORAL_CAPSULE | ORAL | Status: DC | PRN

## 2015-01-27 MED ORDER — LACTATED RINGERS IV SOLN
INTRAVENOUS | Status: DC
  Administered 2015-01-27 (×2): via INTRAVENOUS

## 2015-01-27 MED ORDER — SCOPOLAMINE 1 MG/3DAYS TD PT72
1.0000 | MEDICATED_PATCH | Freq: Once | TRANSDERMAL | Status: AC
  Administered 2015-01-27: 1.5 mg via TRANSDERMAL

## 2015-01-27 MED ORDER — METHYLERGONOVINE MALEATE 0.2 MG PO TABS
0.2000 mg | ORAL_TABLET | Freq: Four times a day (QID) | ORAL | Status: AC
  Administered 2015-01-27 – 2015-01-28 (×5): 0.2 mg via ORAL
  Filled 2015-01-27 (×7): qty 1

## 2015-01-27 MED ORDER — SCOPOLAMINE 1 MG/3DAYS TD PT72
MEDICATED_PATCH | TRANSDERMAL | Status: AC
  Administered 2015-01-27: 1.5 mg via TRANSDERMAL
  Filled 2015-01-27: qty 1

## 2015-01-27 MED ORDER — OXYCODONE-ACETAMINOPHEN 5-325 MG PO TABS
2.0000 | ORAL_TABLET | ORAL | Status: DC | PRN
  Administered 2015-01-28 – 2015-01-30 (×10): 2 via ORAL
  Filled 2015-01-27 (×10): qty 2

## 2015-01-27 MED ORDER — DEXTROSE 5 % IV SOLN
INTRAVENOUS | Status: AC
  Filled 2015-01-27: qty 3000

## 2015-01-27 MED ORDER — ONDANSETRON HCL 4 MG/2ML IJ SOLN
INTRAMUSCULAR | Status: DC | PRN
Start: 2015-01-27 — End: 2015-01-27
  Administered 2015-01-27: 4 mg via INTRAVENOUS

## 2015-01-27 MED ORDER — NALBUPHINE HCL 10 MG/ML IJ SOLN
5.0000 mg | INTRAMUSCULAR | Status: DC | PRN
  Administered 2015-01-27 – 2015-01-28 (×3): 5 mg via INTRAVENOUS
  Filled 2015-01-27 (×4): qty 1

## 2015-01-27 MED ORDER — MORPHINE SULFATE 0.5 MG/ML IJ SOLN
INTRAMUSCULAR | Status: AC
  Filled 2015-01-27: qty 10

## 2015-01-27 MED ORDER — MENTHOL 3 MG MT LOZG: 1.0000 | LOZENGE | OROMUCOSAL | Status: DC | PRN

## 2015-01-27 MED ORDER — DIPHENHYDRAMINE HCL 50 MG/ML IJ SOLN: 12.5000 mg | INTRAMUSCULAR | Status: DC | PRN

## 2015-01-27 MED ORDER — SODIUM BICARBONATE 8.4 % IV SOLN
INTRAVENOUS | Status: AC
  Filled 2015-01-27: qty 50

## 2015-01-27 MED ORDER — IBUPROFEN 600 MG PO TABS
600.0000 mg | ORAL_TABLET | Freq: Four times a day (QID) | ORAL | Status: DC
  Administered 2015-01-27 – 2015-01-30 (×11): 600 mg via ORAL
  Filled 2015-01-27 (×11): qty 1

## 2015-01-27 MED ORDER — ONDANSETRON HCL 4 MG/2ML IJ SOLN
INTRAMUSCULAR | Status: AC
  Filled 2015-01-27: qty 2

## 2015-01-27 MED ORDER — SODIUM CHLORIDE 0.9 % IJ SOLN: 3.0000 mL | INTRAMUSCULAR | Status: DC | PRN

## 2015-01-27 MED ORDER — CEFAZOLIN SODIUM-DEXTROSE 2-3 GM-% IV SOLR
2.0000 g | Freq: Once | INTRAVENOUS | Status: DC
  Filled 2015-01-27: qty 50

## 2015-01-27 MED ORDER — DEXTROSE 5 % IV SOLN
1.0000 ug/kg/h | INTRAVENOUS | Status: DC | PRN
  Filled 2015-01-27: qty 2

## 2015-01-27 MED ORDER — ONDANSETRON HCL 4 MG/2ML IJ SOLN: 4.0000 mg | Freq: Once | INTRAMUSCULAR | Status: DC | PRN

## 2015-01-27 MED ORDER — OXYTOCIN 10 UNIT/ML IJ SOLN
40.0000 [IU] | INTRAMUSCULAR | Status: DC | PRN
  Administered 2015-01-27: 40 [IU] via INTRAVENOUS

## 2015-01-27 MED ORDER — VALACYCLOVIR HCL 500 MG PO TABS
500.0000 mg | ORAL_TABLET | Freq: Every day | ORAL | Status: DC
Start: 2015-01-27 — End: 2015-01-30
  Administered 2015-01-27 – 2015-01-30 (×4): 500 mg via ORAL
  Filled 2015-01-27 (×5): qty 1

## 2015-01-27 MED ORDER — OXYCODONE-ACETAMINOPHEN 5-325 MG PO TABS
1.0000 | ORAL_TABLET | ORAL | Status: DC | PRN
  Administered 2015-01-27: 1 via ORAL
  Filled 2015-01-27: qty 1

## 2015-01-27 MED ORDER — METHYLERGONOVINE MALEATE 0.2 MG/ML IJ SOLN
0.2000 mg | Freq: Four times a day (QID) | INTRAMUSCULAR | Status: AC
  Administered 2015-01-27: 0.2 mg via INTRAMUSCULAR
  Filled 2015-01-27 (×2): qty 1

## 2015-01-27 MED ORDER — KETOROLAC TROMETHAMINE 30 MG/ML IJ SOLN
INTRAMUSCULAR | Status: AC
  Administered 2015-01-27: 30 mg via INTRAVENOUS
  Filled 2015-01-27: qty 1

## 2015-01-27 MED ORDER — SENNOSIDES-DOCUSATE SODIUM 8.6-50 MG PO TABS
2.0000 | ORAL_TABLET | ORAL | Status: DC
  Administered 2015-01-27 – 2015-01-30 (×3): 2 via ORAL
  Filled 2015-01-27 (×3): qty 2

## 2015-01-27 MED ORDER — OXYTOCIN 10 UNIT/ML IJ SOLN
INTRAMUSCULAR | Status: AC
  Filled 2015-01-27: qty 4

## 2015-01-27 MED ORDER — ZOLPIDEM TARTRATE 5 MG PO TABS: 5.0000 mg | ORAL_TABLET | Freq: Every evening | ORAL | Status: DC | PRN

## 2015-01-27 MED ORDER — FERROUS SULFATE 325 (65 FE) MG PO TABS
325.0000 mg | ORAL_TABLET | Freq: Two times a day (BID) | ORAL | Status: DC
  Administered 2015-01-27 – 2015-01-30 (×6): 325 mg via ORAL
  Filled 2015-01-27 (×6): qty 1

## 2015-01-27 SURGICAL SUPPLY — 41 items
BARRIER ADHS 3X4 INTERCEED (GAUZE/BANDAGES/DRESSINGS) ×3 IMPLANT
BENZOIN TINCTURE PRP APPL 2/3 (GAUZE/BANDAGES/DRESSINGS) ×3 IMPLANT
CLAMP CORD UMBIL (MISCELLANEOUS) IMPLANT
CLOSURE WOUND 1/2 X4 (GAUZE/BANDAGES/DRESSINGS) ×1
CLOTH BEACON ORANGE TIMEOUT ST (SAFETY) ×3 IMPLANT
CONTAINER PREFILL 10% NBF 15ML (MISCELLANEOUS) IMPLANT
DRAPE SHEET LG 3/4 BI-LAMINATE (DRAPES) IMPLANT
DRSG OPSITE POSTOP 4X10 (GAUZE/BANDAGES/DRESSINGS) ×3 IMPLANT
DURAPREP 26ML APPLICATOR (WOUND CARE) ×3 IMPLANT
ELECT REM PT RETURN 9FT ADLT (ELECTROSURGICAL) ×3
ELECTRODE REM PT RTRN 9FT ADLT (ELECTROSURGICAL) ×1 IMPLANT
EXTRACTOR VACUUM M CUP 4 TUBE (SUCTIONS) IMPLANT
EXTRACTOR VACUUM M CUP 4' TUBE (SUCTIONS)
GAUZE SPONGE 4X4 12PLY STRL (GAUZE/BANDAGES/DRESSINGS) ×3 IMPLANT
GLOVE BIOGEL M 6.5 STRL (GLOVE) ×6 IMPLANT
GLOVE BIOGEL PI IND STRL 6.5 (GLOVE) ×1 IMPLANT
GLOVE BIOGEL PI INDICATOR 6.5 (GLOVE) ×2
GOWN STRL REUS W/TWL LRG LVL3 (GOWN DISPOSABLE) ×9 IMPLANT
KIT ABG SYR 3ML LUER SLIP (SYRINGE) IMPLANT
NEEDLE HYPO 25X5/8 SAFETYGLIDE (NEEDLE) IMPLANT
NS IRRIG 1000ML POUR BTL (IV SOLUTION) ×3 IMPLANT
PACK C SECTION WH (CUSTOM PROCEDURE TRAY) ×3 IMPLANT
PAD ABD 7.5X8 STRL (GAUZE/BANDAGES/DRESSINGS) ×3 IMPLANT
PAD OB MATERNITY 4.3X12.25 (PERSONAL CARE ITEMS) ×3 IMPLANT
RETAINER VISCERAL (MISCELLANEOUS) ×3 IMPLANT
RTRCTR C-SECT PINK 25CM LRG (MISCELLANEOUS) ×3 IMPLANT
RTRCTR C-SECT PINK 34CM XLRG (MISCELLANEOUS) IMPLANT
STAPLER VISISTAT 35W (STAPLE) IMPLANT
STRIP CLOSURE SKIN 1/2X4 (GAUZE/BANDAGES/DRESSINGS) ×2 IMPLANT
SUT PDS AB 0 CT1 27 (SUTURE) ×6 IMPLANT
SUT PLAIN 0 NONE (SUTURE) IMPLANT
SUT PLAIN 2 0 XLH (SUTURE) ×3 IMPLANT
SUT VIC AB 0 CTX 36 (SUTURE) ×6
SUT VIC AB 0 CTX36XBRD ANBCTRL (SUTURE) ×3 IMPLANT
SUT VIC AB 2-0 CT1 27 (SUTURE) ×4
SUT VIC AB 2-0 CT1 TAPERPNT 27 (SUTURE) ×2 IMPLANT
SUT VIC AB 3-0 SH 27 (SUTURE)
SUT VIC AB 3-0 SH 27X BRD (SUTURE) IMPLANT
SUT VIC AB 4-0 KS 27 (SUTURE) ×3 IMPLANT
TOWEL OR 17X24 6PK STRL BLUE (TOWEL DISPOSABLE) ×3 IMPLANT
TRAY FOLEY CATH 14FR (SET/KITS/TRAYS/PACK) ×3 IMPLANT

## 2015-01-27 NOTE — Transfer of Care (Signed)
Immediate Anesthesia Transfer of Care Note  Patient: [name] [name]  Procedure(s) Performed: Procedure(s): CESAREAN SECTION (N/A)  Patient Location: PACU  Anesthesia Type:Epidural  Level of Consciousness: awake and alert   Airway & Oxygen Therapy: Patient Spontanous Breathing  Post-op Assessment: Report given to RN and Post -op Vital signs reviewed and stable  Post vital signs: Reviewed and stable  Last Vitals:  Filed Vitals:   01/27/15 0530  BP: 123/73  Pulse: 91  Temp:   Resp:     Complications: No apparent anesthesia complications

## 2015-01-27 NOTE — Op Note (Signed)
Cesarean Section Procedure Note  Indications: failure to progress: arrest of dilation  Pre-operative Diagnosis: 40 week 6 day pregnancy.  Post-operative Diagnosis: same  Surgeon: Jessee AversOLE,Maybell Misenheimer J.   Assistants: Gerrit HeckJessica Emly CNM  Anesthesia: Epidural anesthesia  ASA Class: 2   Procedure Details   The patient was seen in the Holding Room. The risks, benefits, complications, treatment options, and expected outcomes were discussed with the patient.  The patient concurred with the proposed plan, giving informed consent.  The site of surgery properly noted/marked. The patient was taken to Operating Room # 9, identified as [name] [name] and the procedure verified as C-Section Delivery. A Time Out was held and the above information confirmed.  After induction of anesthesia, the patient was draped and prepped in the usual sterile manner. A Pfannenstiel incision was made and carried down through the subcutaneous tissue to the fascia. Fascial incision was made and extended transversely. The fascia was separated from the underlying rectus tissue superiorly and inferiorly. The peritoneum was identified and entered. Peritoneal incision was extended longitudinally. The utero-vesical peritoneal reflection was incised transversely and the bladder flap was bluntly freed from the lower uterine segment. A low transverse uterine incision was made. Delivered from cephalic presentation was a  Female with Apgar scores of 9 at one minute and 9 at five minutes. After the umbilical cord was clamped and cut cord blood was obtained for evaluation. The placenta was removed intact and appeared normal. The uterine outline, tubes and ovaries appeared normal. The uterine incision was closed with running locked sutures of o vicryl . A second layer of zero vicryl was used to imbricate the incision. Hemostasis was observed. Lavage was carried out until clear. Interseed was placed along the uterine  Incision. The peritoneum was  reapproximated with 2-0 vicryl.  The fascia was then reapproximated with running sutures of 0 pds. The skin was reapproximated with 4-0 vicryl .  Instrument, sponge, and needle counts were correct prior the abdominal closure and at the conclusion of the case.   Findings: Female infant in the cephalic presentation. Nuchal cord x 1Normal fallopian tubes and ovaries   Estimated Blood Loss:  900 mL         Drains: foley catheter           Total IV Fluids:  Per anesthesia ml         Specimens: Placenta and sent to labor and delivery            Implants: none         Complications:  None; patient tolerated the procedure well.         Disposition: PACU - hemodynamically stable.         Condition: stable  Attending Attestation: I performed the procedure.

## 2015-01-27 NOTE — Anesthesia Postprocedure Evaluation (Signed)
Anesthesia Post Note  Patient: [name] [name]  Procedure(s) Performed: Procedure(s) (LRB): CESAREAN SECTION (N/A)  Anesthesia type: Epidural  Patient location: PACU  Post pain: Pain level controlled  Post assessment: Post-op Vital signs reviewed  Last Vitals:  Filed Vitals:   01/27/15 0815  BP:   Pulse: 96  Temp:   Resp: 22    Post vital signs: Reviewed  Level of consciousness: awake  Complications: No apparent anesthesia complications

## 2015-01-27 NOTE — Lactation Note (Signed)
This note was copied from the chart of [name] [name]. Lactation Consultation Note  Initial visit made.  Breastfeeding consultation services and support information given to patient.  Baby is 5 hours old.  Assisted with positioning baby in football hold on right breast.  Demonstrated hand expression and several drops expressed into baby's mouth.  Mom has short erect nipples and baby latched easily with areolar compression.  Baby nursed actively for 10 minutes.  Instructed to feed with any feeding cue and to call for assist prn.  Patient Name: [name] [name] Today's Date: 01/27/2015 Reason for consult: Initial assessment   Maternal Data    Feeding Feeding Type: Breast Fed Length of feed: 10 min  LATCH Score/Interventions Latch: Grasps breast easily, tongue down, lips flanged, rhythmical sucking. Intervention(s): Assist with latch;Adjust position;Breast massage;Breast compression  Audible Swallowing: A few with stimulation  Type of Nipple: Everted at rest and after stimulation (SHORT SHAFT)  Comfort (Breast/Nipple): Soft / non-tender     Hold (Positioning): Assistance needed to correctly position infant at breast and maintain latch. Intervention(s): Breastfeeding basics reviewed;Support Pillows;Position options;Skin to skin  LATCH Score: 8  Lactation Tools Discussed/Used     Consult Status Consult Status: Follow-up Date: 01/28/15 Follow-up type: In-patient    [name], Trew Sunde S 01/27/2015, 12:06 PM

## 2015-01-27 NOTE — Anesthesia Postprocedure Evaluation (Signed)
  Anesthesia Post-op Note  Patient: [name] [name]  Procedure(s) Performed: Procedure(s): CESAREAN SECTION (N/A)  Patient Location: Mother/Baby  Anesthesia Type:Epidural  Level of Consciousness: awake  Airway and Oxygen Therapy: Patient Spontanous Breathing  Post-op Pain: mild  Post-op Assessment: Patient's Cardiovascular Status Stable and Respiratory Function Stable  Post-op Vital Signs: stable  Last Vitals:  Filed Vitals:   01/27/15 1300  BP: 107/88  Pulse: 104  Temp: 37.2 C  Resp: 20    Complications: No apparent anesthesia complications

## 2015-01-27 NOTE — Addendum Note (Signed)
Addendum  created 01/27/15 1520 by Renford DillsJanet L Dhriti Fales, CRNA   Modules edited: Notes Section   Notes Section:  File: [id number]

## 2015-01-27 NOTE — Progress Notes (Signed)
In to assess patient due to reports of variable decels.  Patient reports intense pressure / tearful and crying/  AF vss Cervix 4/90/-1  FHT baseline 140 good btbv+accels variable decels  Toco contractions q 2 minutes.  A/P 40 wks 6 days with failure to progress. Recommend cesarean section.. R/b/a of surgery discussed with patient including but not limited to infection/ bleeding damage to bowel bladder baby with the need for further surgery. R/o transfusion discussed. Pt voiced understanding and desires to proceed.

## 2015-01-28 ENCOUNTER — Inpatient Hospital Stay (HOSPITAL_COMMUNITY): Admission: RE | Admit: 2015-01-28 | Payer: BC Managed Care – PPO | Source: Ambulatory Visit

## 2015-01-28 LAB — CBC
HEMATOCRIT: 31.4 % — AB (ref 36.0–46.0)
Hemoglobin: 10.8 g/dL — ABNORMAL LOW (ref 12.0–15.0)
MCH: 32 pg (ref 26.0–34.0)
MCHC: 34.4 g/dL (ref 30.0–36.0)
MCV: 92.9 fL (ref 78.0–100.0)
Platelets: 155 10*3/uL (ref 150–400)
RBC: 3.38 MIL/uL — ABNORMAL LOW (ref 3.87–5.11)
RDW: 13.9 % (ref 11.5–15.5)
WBC: 7.2 10*3/uL (ref 4.0–10.5)

## 2015-01-28 NOTE — Lactation Note (Signed)
This note was copied from the chart of [name] Niala Shughart. Lactation Consultation Note  Follow up visit made.  Mom states she is giving formula because baby not latching.  Offered assist with latch but mom would like to visit with family that just arrived.  Instructed to call for lactation assist when baby is ready to eat.  Patient Name: [name] Benson [place]race[place] Lafoy EAVWU'JToday's Date: 01/28/2015     Maternal Data    Feeding Feeding Type: Formula  LATCH Score/Interventions                      Lactation Tools Discussed/Used     Consult Status      [name], [name] 01/28/2015, 10:37 AM

## 2015-01-28 NOTE — Progress Notes (Signed)
Clinical Social Work Department PSYCHOSOCIAL ASSESSMENT - MATERNAL/CHILD 01/28/2015  Patient:  [name],[name]  Account Number:  [id number]  Admit Date:  01/25/2015  Childs Name:   [name] [name]    Clinical Social Worker:  Tatianna Ibbotson, LCSW   Date/Time:  01/28/2015 11:00 AM  Date Referred:  01/27/2015   Referral source  Central Nurs[facility]     Referred reason  Depression/Anxiety   Other referral source:    I:  FAMILY / HOME ENVIRONMENT Child's legal guardian:  PARENT  Guardian - Name Guardian - Age Guardian - Address  [name] [name],[name] [name] [address]  [place], [place] [address]   Other household support members/support persons Other support:    II  PSYCHOSOCIAL DATA Information Source:    Financial and Community Resources Employment:   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 She is a single parent with no other dependents.  She was pleasant and receptive to CSW. Mother states that she was in a relationship that ended around the time she became pregnant and plan on DNA testing to confirm paternity.    Mother reports hx with depression prior to pregnancy and states that she participated in therapy for about 4 month.    She denies any current symptoms of depression or symptoms during pregnancy.   She denies any hx of substance abuse.  She reports adequate support and states that she is well prepared for newborn's homecoming.   No acute social concerns related at this time.   Informed them of CSW availability.          VI SOCIAL WORK PLAN Social Work Plan  No Further Intervention Required / No Barriers to Discharge   Type of pt/family education:   PP Depression signs/symptoms and resources   If child protective services report - county:   If child protective services report - date:   Information/referral to community resources comment:   Other social work plan:     

## 2015-01-28 NOTE — Progress Notes (Signed)
Subjective: Postpartum Day 1: Cesarean Delivery Patient reports incisional pain, tolerating PO, + flatus and no problems voiding.    Objective: Vital signs in last 24 hours: Temp:  [97.6 F (36.4 C)-99.6 F (37.6 C)] 97.6 F (36.4 C) (01/31 0539) Pulse Rate:  [77-116] 77 (01/31 0539) Resp:  [18-20] 18 (01/31 0539) BP: (104-125)/(52-88) 105/68 mmHg (01/31 0539) SpO2:  [96 %-100 %] 100 % (01/31 0539)  Physical Exam:  General: alert and cooperative Lochia: appropriate Uterine Fundus: firm Incision: healing well DVT Evaluation: No evidence of DVT seen on physical exam.   Recent Labs  01/25/15 2345 01/28/15 0616  HGB 12.4 10.8*  HCT 35.9* 31.4*    Assessment/Plan: Status post Cesarean section. Doing well postoperatively.  Continue current care.  Shyenne Maggard J. 01/28/2015, 9:30 AM

## 2015-01-29 ENCOUNTER — Encounter (HOSPITAL_COMMUNITY): Payer: Self-pay | Admitting: Obstetrics and Gynecology

## 2015-01-29 NOTE — Progress Notes (Signed)
Subjective: Postop Day 2: Cesarean Delivery due to failure to progress, arrest of dilitation No complaints.  Pain controlled.  Lochia normal.  Breast feeding no.  Objective: Temp:  [98 F (36.7 C)-98.6 F (37 C)] 98 F (36.7 C) (02/01 0619) Pulse Rate:  [67-73] 73 (02/01 0619) Resp:  [18] 18 (02/01 0619) BP: (106-119)/(66-69) 119/69 mmHg (02/01 [id number])  Physical Exam: Gen: NAD Lochia: Not visualized Uterine Fundus: firm, appropriately tender Incision: clean, dry and intact, healing well.  Honey comb partially soiled.  Appears old.  Previously marked. DVT Evaluation: + Edema present, no calf tenderness bilaterally    Recent Labs  01/28/15 0616  HGB 10.8*  HCT 31.4*    Assessment/Plan: Status post C-section-doing well postoperatively. Routine post op care. Anticipate discharge in am. H/o depression.  Will f/u 2 weeks postpartum.   [name] [name] 01/29/2015, 9:08 AM

## 2015-01-30 MED ORDER — IBUPROFEN 600 MG PO TABS: 600.0000 mg | ORAL_TABLET | Freq: Four times a day (QID) | ORAL | Status: AC

## 2015-01-30 MED ORDER — OXYCODONE-ACETAMINOPHEN 5-325 MG PO TABS: ORAL_TABLET | ORAL | Status: AC

## 2015-01-30 NOTE — Discharge Instructions (Signed)
Cesarean Delivery, Care After °Refer to this sheet in the next few weeks. These instructions provide you with information on caring for yourself after your procedure. Your health care provider may also give you specific instructions. Your treatment has been planned according to current medical practices, but problems sometimes occur. Call your health care provider if you have any problems or questions after you go home. °HOME CARE INSTRUCTIONS  °· Only take over-the-counter or prescription medications as directed by your health care provider. °· Do not drink alcohol, especially if you are breastfeeding or taking medication to relieve pain. °· Do not chew or smoke tobacco. °· Continue to use good perineal care. Good perineal care includes: °· Wiping your perineum from front to back. °· Keeping your perineum clean. °· Check your surgical cut (incision) daily for increased redness, drainage, swelling, or separation of skin. °· Clean your incision gently with soap and water every day, and then pat it dry. If your health care provider says it is okay, leave the incision uncovered. Use a bandage (dressing) if the incision is draining fluid or appears irritated. If the adhesive strips across the incision do not fall off within 7 days, carefully peel them off. °· Hug a pillow when coughing or sneezing until your incision is healed. This helps to relieve pain. °· Do not use tampons or douche until your health care provider says it is okay. °· Shower, wash your hair, and take tub baths as directed by your health care provider. °· Wear a well-fitting bra that provides breast support. °· Limit wearing support panties or control-top hose. °· Drink enough fluids to keep your urine clear or pale yellow. °· Eat high-fiber foods such as whole grain cereals and breads, brown rice, beans, and fresh fruits and vegetables every day. These foods may help prevent or relieve constipation. °· Resume activities such as climbing stairs,  driving, lifting, exercising, or traveling as directed by your health care provider. °· Talk to your health care provider about resuming sexual activities. This is dependent upon your risk of infection, your rate of healing, and your comfort and desire to resume sexual activity. °· Try to have someone help you with your household activities and your newborn for at least a few days after you leave the hospital. °· Rest as much as possible. Try to rest or take a nap when your newborn is sleeping. °· Increase your activities gradually. °· Keep all of your scheduled postpartum appointments. It is very important to keep your scheduled follow-up appointments. At these appointments, your health care provider will be checking to make sure that you are healing physically and emotionally. °SEEK MEDICAL CARE IF:  °· You are passing large clots from your vagina. Save any clots to show your health care provider. °· You have a foul smelling discharge from your vagina. °· You have trouble urinating. °· You are urinating frequently. °· You have pain when you urinate. °· You have a change in your bowel movements. °· You have increasing redness, pain, or swelling near your incision. °· You have pus draining from your incision. °· Your incision is separating. °· You have painful, hard, or reddened breasts. °· You have a severe headache. °· You have blurred vision or see spots. °· You feel sad or depressed. °· You have thoughts of hurting yourself or your newborn. °· You have questions about your care, the care of your newborn, or medications. °· You are dizzy or light-headed. °· You have a rash. °· You   have pain, redness, or swelling at the site of the removed intravenous access (IV) tube. °· You have nausea or vomiting. °· You stopped breastfeeding and have not had a menstrual period within 12 weeks of stopping. °· You are not breastfeeding and have not had a menstrual period within 12 weeks of delivery. °· You have a fever. °SEEK  IMMEDIATE MEDICAL CARE IF: °· You have persistent pain. °· You have chest pain. °· You have shortness of breath. °· You faint. °· You have leg pain. °· You have stomach pain. °· Your vaginal bleeding saturates 2 or more sanitary pads in 1 hour. °MAKE SURE YOU:  °· Understand these instructions. °· Will watch your condition. °· Will get help right away if you are not doing well or get worse. °Document Released: 09/06/2002 Document Revised: 05/01/2014 Document Reviewed: 08/11/2012 °Exit[employer]® Patient Information ©2015 [employer]. This information is not intended to replace advice given to you by your health care provider. Make sure you discuss any questions you have with your health care provider. °Postpartum Depression and Baby Blues °The postpartum period begins right after the birth of a baby. During this time, there is often a great amount of joy and excitement. It is also a time of many changes in the life of the parents. Regardless of how many times a mother gives birth, each child brings new challenges and dynamics to the family. It is not unusual to have feelings of excitement along with confusing shifts in moods, emotions, and thoughts. All mothers are at risk of developing postpartum depression or the "baby blues." These mood changes can occur right after giving birth, or they may occur many months after giving birth. The baby blues or postpartum depression can be mild or severe. Additionally, postpartum depression can go away rather quickly, or it can be a long-term condition.  °CAUSES °Raised hormone levels and the rapid drop in those levels are thought to be a main cause of postpartum depression and the baby blues. A number of hormones change during and after pregnancy. Estrogen and progesterone usually decrease right after the delivery of your baby. The levels of thyroid hormone and various cortisol steroids also rapidly drop. Other factors that play a role in these mood changes include major life  events and genetics.  °RISK FACTORS °If you have any of the following risks for the baby blues or postpartum depression, know what symptoms to watch out for during the postpartum period. Risk factors that may increase the likelihood of getting the baby blues or postpartum depression include: °· Having a personal or family history of depression.   °· Having depression while being pregnant.   °· Having premenstrual mood issues or mood issues related to oral contraceptives. °· Having a lot of life stress.   °· Having marital conflict.   °· Lacking a social support network.   °· Having a baby with special needs.   °· Having health problems, such as diabetes.   °SIGNS AND SYMPTOMS °Symptoms of baby blues include: °· Brief changes in mood, such as going from extreme happiness to sadness. °· Decreased concentration.   °· Difficulty sleeping.   °· Crying spells, tearfulness.   °· Irritability.   °· Anxiety.   °Symptoms of postpartum depression typically begin within the first month after giving birth. These symptoms include: °· Difficulty sleeping or excessive sleepiness.   °· Marked weight loss.   °· Agitation.   °· Feelings of worthlessness.   °· Lack of interest in activity or food.   °Postpartum psychosis is a very serious condition and can be dangerous. Fortunately, it   is rare. Displaying any of the following symptoms is cause for immediate medical attention. Symptoms of postpartum psychosis include:  °· Hallucinations and delusions.   °· Bizarre or disorganized behavior.   °· Confusion or disorientation.   °DIAGNOSIS  °A diagnosis is made by an evaluation of your symptoms. There are no medical or lab tests that lead to a diagnosis, but there are various questionnaires that a health care provider may use to identify those with the baby blues, postpartum depression, or psychosis. Often, a screening tool called the Edinburgh Postnatal Depression Scale is used to diagnose depression in the postpartum period.   °TREATMENT °The baby blues usually goes away on its own in 1-2 weeks. Social support is often all that is needed. You will be encouraged to get adequate sleep and rest. Occasionally, you may be given medicines to help you sleep.  °Postpartum depression requires treatment because it can last several months or longer if it is not treated. Treatment may include individual or group therapy, medicine, or both to address any social, physiological, and psychological factors that may play a role in the depression. Regular exercise, a healthy diet, rest, and social support may also be strongly recommended.  °Postpartum psychosis is more serious and needs treatment right away. Hospitalization is often needed. °HOME CARE INSTRUCTIONS °· Get as much rest as you can. Nap when the baby sleeps.   °· Exercise regularly. Some women find yoga and walking to be beneficial.   °· Eat a balanced and nourishing diet.   °· Do little things that you enjoy. Have a cup of tea, take a bubble bath, read your favorite magazine, or listen to your favorite music. °· Avoid alcohol.   °· Ask for help with household chores, cooking, grocery shopping, or running errands as needed. Do not try to do everything.   °· Talk to people close to you about how you are feeling. Get support from your partner, family members, friends, or other new moms. °· Try to stay positive in how you think. Think about the things you are grateful for.   °· Do not spend a lot of time alone.   °· Only take over-the-counter or prescription medicine as directed by your health care provider. °· Keep all your postpartum appointments.   °· Let your health care provider know if you have any concerns.   °SEEK MEDICAL CARE IF: °You are having a reaction to or problems with your medicine. °SEEK IMMEDIATE MEDICAL CARE IF: °· You have suicidal feelings.   °· You think you may harm the baby or someone else. °MAKE SURE YOU: °· Understand these instructions. °· Will watch your  condition. °· Will get help right away if you are not doing well or get worse. °Document Released: 09/18/2004 Document Revised: 12/20/2013 Document Reviewed: 09/26/2013 °ExitCare® Patient Information ©2015 [employer]. This information is not intended to replace advice given to you by your health care provider. Make sure you discuss any questions you have with your health care provider. ° °

## 2015-01-30 NOTE — Discharge Summary (Signed)
Obstetric Discharge Summary Reason for Admission: rupture of membranes Prenatal Procedures: ultrasound Intrapartum Procedures: cesarean: low cervical, transverse-Failure to progress, arrest at 4 cm.  Got epidural at 1 cm. Postpartum Procedures: none Complications-Operative and Postpartum: none HEMOGLOBIN  Date Value Ref Range Status  01/28/2015 10.8* 12.0 - 15.0 g/dL Final   HCT  Date Value Ref Range Status  01/28/2015 31.4* 36.0 - 46.0 % Final    Physical Exam:  General: alert and no distress Lochia: appropriate Uterine Fundus: firm Incision: honeycomb slightly soiled-stable DVT Evaluation: No evidence of DVT seen on physical exam. Calf/Ankle edema is present.  Discharge Diagnoses: Term Pregnancy-delivered  Discharge Information: Date: 01/30/2015 Activity: pelvic rest Diet: routine Medications: PNV, Ibuprofen and Percocet Condition: stable Instructions: See discharge instructions. Discharge to: home Follow-up Information    Follow up with Geryl RankinsVARNADO, Juddson Cobern, MD. Schedule an appointment as soon as possible for a visit in 2 weeks.   Specialty:  Obstetrics and Gynecology   Why:  Post op incision check   Contact information:   [address] [place] [address] [phone]       Newborn Data: Live born female  Birth Weight: 7 lb 11.6 oz (3505 g) APGAR: 8, 9  Home with mother.  Geryl RankinsVARNADO, Deklyn Gibbon 01/30/2015, 7:13 AM

## 2015-01-30 NOTE — Lactation Note (Signed)
This note was copied from the chart of Girl Karrisa Yager. Lactation Consultation Note Mom states she has changed over to bottle feeding. She didn't like using the NS. Explained that she could always pump and bottle feed her breast milk if she chose to do so. Discussed supply and demand. Discussed engorgement and management. Reminded of our OP LC services if needed.  Patient Name: Girl Benson [place]race[place] Winning ZOXWR'UToday's Date: 01/30/2015 Reason for consult: Follow-up assessment   Maternal Data Has patient been taught Hand Expression?: Yes Does the patient have breastfeeding experience prior to this delivery?: No  Feeding Feeding Type: Bottle Fed - Formula Nipple Type: Slow - flow  LATCH Score/Interventions                      Lactation Tools Discussed/Used     Consult Status Consult Status: Complete Date: 01/30/15 Follow-up type: Call as needed    Lezli Danek, Diamond NickelLAURA G 01/30/2015, 8:48 AM

## 2015-02-14 ENCOUNTER — Encounter (HOSPITAL_COMMUNITY): Payer: Self-pay | Admitting: Obstetrics and Gynecology

## 2015-02-14 NOTE — Addendum Note (Signed)
Addendum  created 02/14/15 1222 by Felipe DroneMary Jennette Baudelia Schroepfer, MD   Modules edited: Anesthesia Events, Narrator   Narrator:  Narrator: Event Log Edited

## 2016-05-10 IMAGING — US US OB COMP +14 WK
1 series · 12 of 28 positions shown · non-contrast
Comparison: none

[Series 1: us ob comp +14 wk · 12 of 45 slices shown]
[im 2/45]
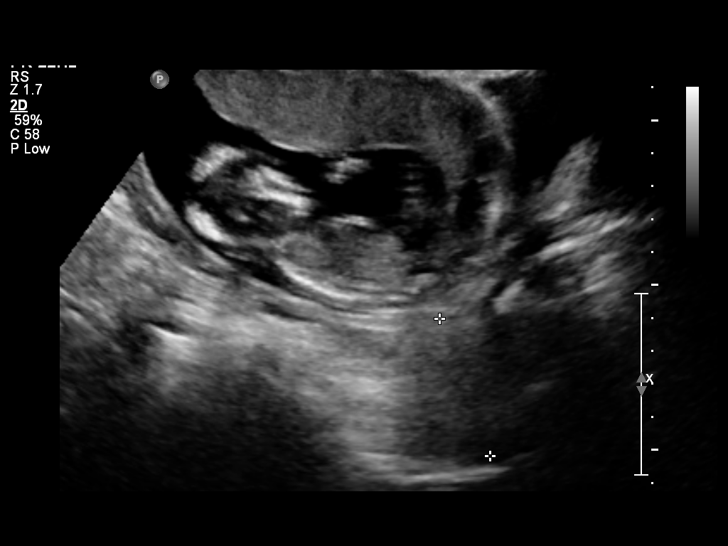
[im 5/45]
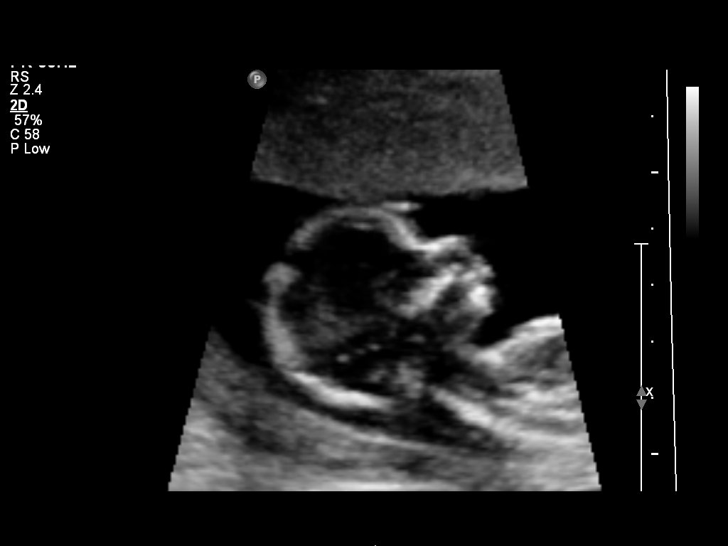
[im 9/45]
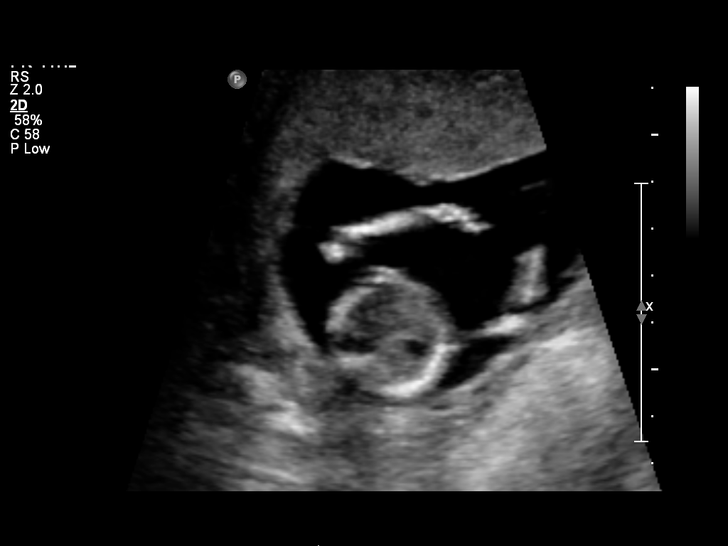
[im 14/45]
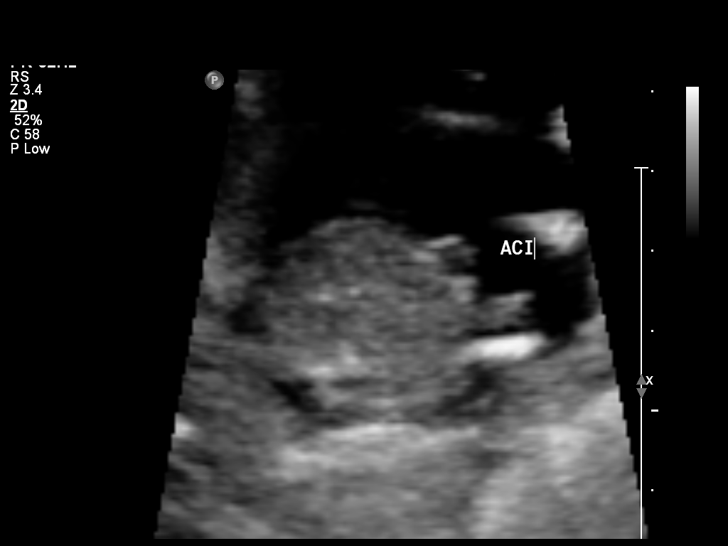
[im 17/45]
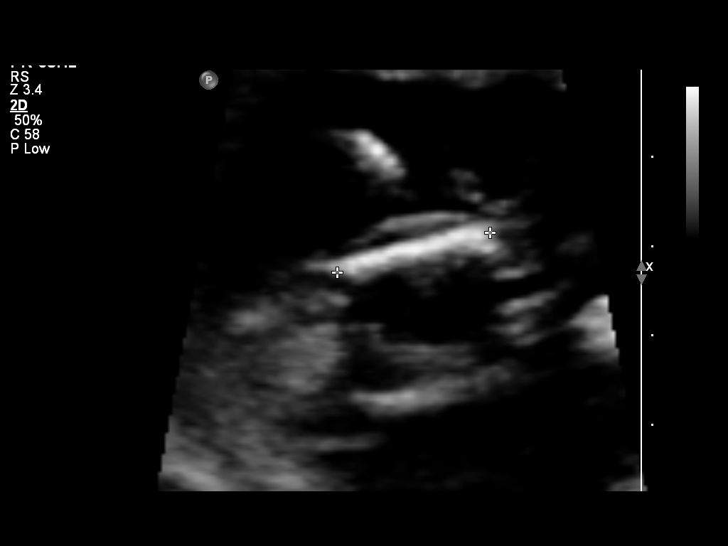
[im 20/45]
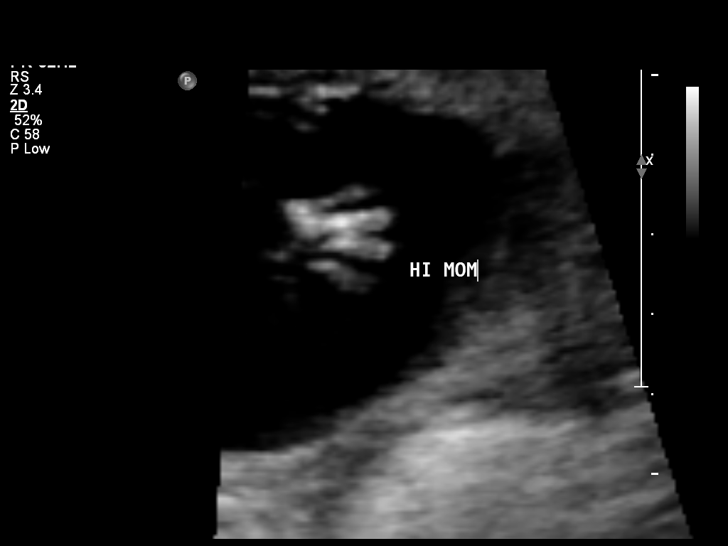
[im 25/45]
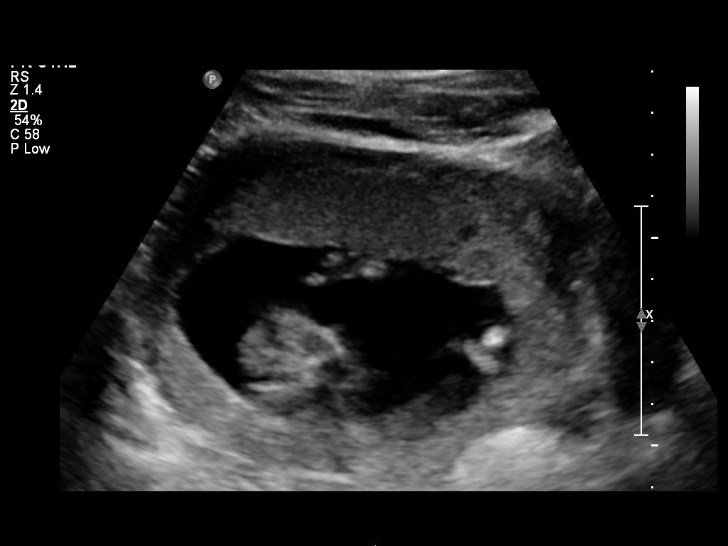
[im 28/45]
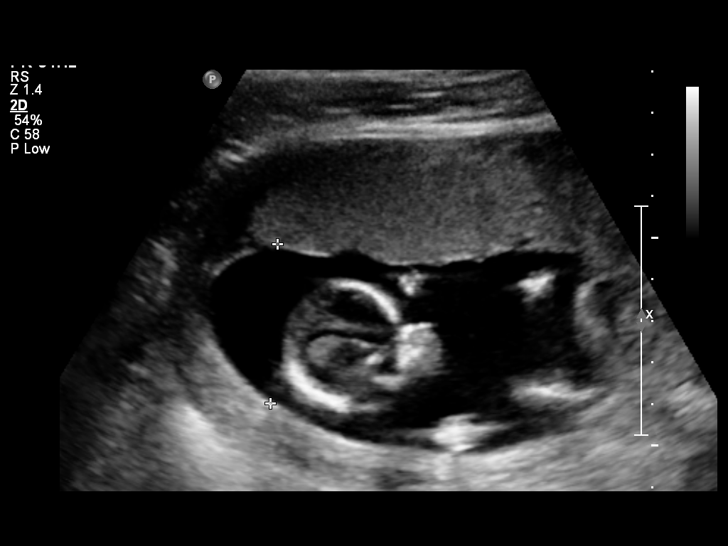
[im 31/45]
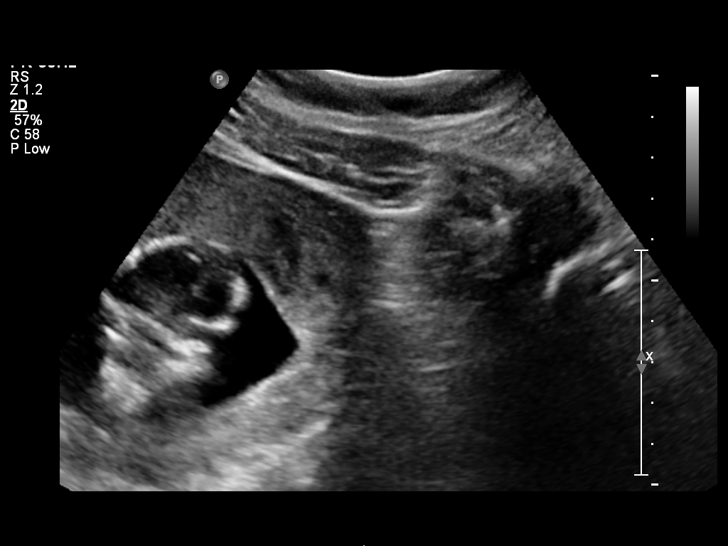
[im 36/45]
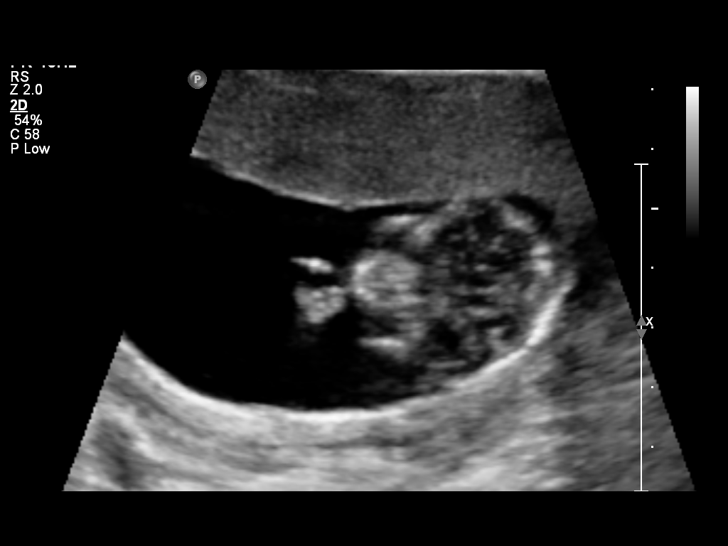
[im 40/45]
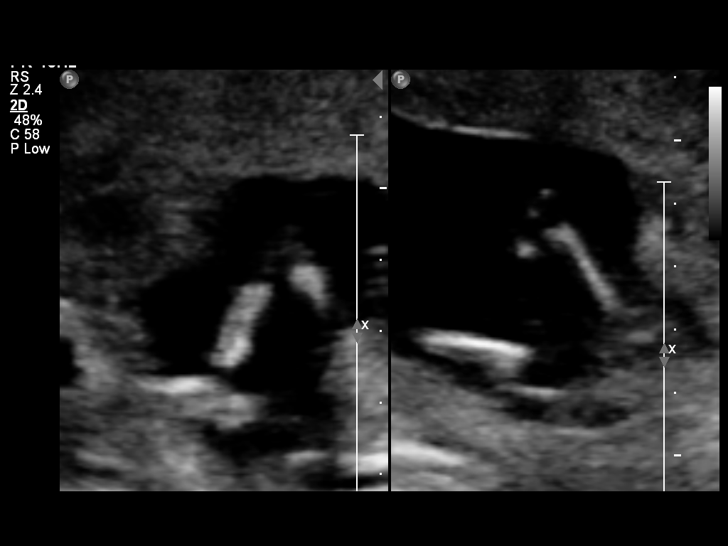
[im 43/45]
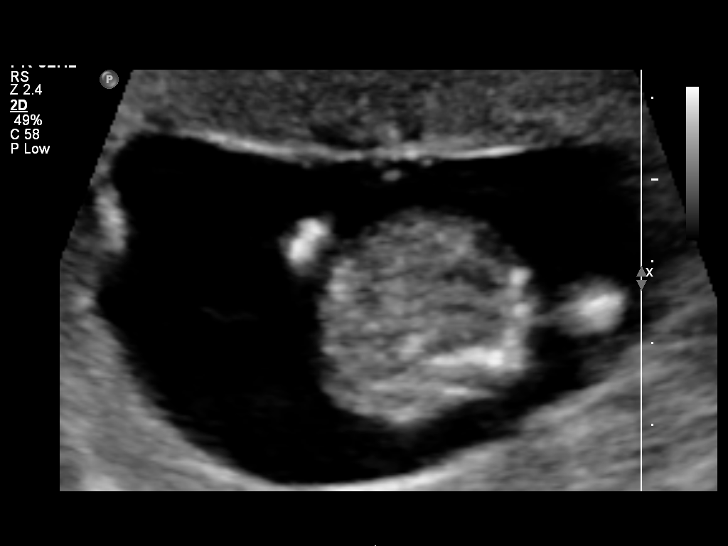

[12 of 28 positions shown; findings below may reference images not displayed]

OBSTETRICS REPORT
                      (Signed Final 08/03/2014 [DATE])

Service(s) Provided

 US OB COMP + 14 WK                                    76805.1
Indications

 Basic anatomic survey
 Obesity complicating pregnancy                        649.13,
Fetal Evaluation

 Num Of Fetuses:    1
 Fetal Heart Rate:  147                          bpm
 Cardiac Activity:  Observed
 Presentation:      Breech
 Placenta:          Anterior, above cervical os
 P. Cord            Visualized, central
 Insertion:

 Amniotic Fluid
 AFI FV:      Subjectively within normal limits
                                             Larg Pckt:    3.81  cm
 LUQ:   3.81    cm
Biometry

 BPD:     29.4  mm     G. Age:  15w 2d                CI:         71.3   70 - 86
                                                      FL/HC:      15.9   15.3 -

 HC:     110.9  mm     G. Age:  15w 3d       36  %    HC/AC:      1.24   1.05 -

 AC:      89.6  mm     G. Age:  15w 1d       49  %    FL/BPD:
 FL:      17.6  mm     G. Age:  15w 1d       42  %    FL/AC:      19.6   20 - 24
 HUM:     18.3  mm     G. Age:  15w 2d       55  %

 Est. FW:     118  gm      0 lb 4 oz     73  %
Gestational Age

 U/S Today:     15w 2d                                        EDD:   01/23/15
 Best:          15w 2d     Det. By:  U/S (08/03/14)           EDD:   01/23/15
Anatomy

 Cranium:          Appears normal         Aortic Arch:      Not well visualized
 Fetal Cavum:      Not well visualized    Ductal Arch:      Not well visualized
 Ventricles:       Appears normal         Diaphragm:        Appears normal
 Choroid Plexus:   Appears normal         Stomach:          Appears normal, left
                                                            sided
 Cerebellum:       Not well visualized    Abdomen:          Appears normal
 Posterior Fossa:  Not well visualized    Abdominal Wall:   Appears nml (cord
                                                            insert, abd wall)
 Nuchal Fold:      Not applicable (< 16   Cord Vessels:     Appears normal (3
                   wks GA)                                  vessel cord)
 Face:             Appears normal         Kidneys:          Appear normal
                   (orbits and profile)
 Lips:             Not well visualized    Bladder:          Appears normal
 Heart:            Not well visualized    Spine:            Not well visualized
 RVOT:             Not well visualized    Lower             Not well visualized
                                          Extremities:
 LVOT:             Not well visualized    Upper             Appears normal
                                          Extremities:

 Other:  Gender not well visualized. Technically difficult due to early
         gestational age. Technically difficult due to  maternal habitus.
Targeted Anatomy

 Fetal Central Nervous System
 Lat. Ventricles:
Cervix Uterus Adnexa

 Cervical Length:    3.87     cm

 Cervix:       Normal appearance by transabdominal scan.
 Left Ovary:    No adnexal mass visualized.
 Right Ovary:   No adnexal mass visualized.
Comments

 The patient's fetal anatomic survey is not complete.
 However, no gross fetal anomalies were identified. anatomy.

 Ms. Lacasse is unsure of her LMP.  She reports having an early
 ultrasound at an [HOSPITAL] on 07/04/14 and was told she
 was 05w2d.  No report of that ultrasound is available for
 review.
Impression

 Single living intrauterine pregnancy at 15 weeks 2 days.
 Biometry not consistent with EDC based on patient report of
 outside ultrasound.
 Pregnancy should be dated by today's ultrasound.
 Appropriate fetal growth (73%).
 Normal amniotic fluid volume.
 No gross fetal anomalies identified.
Recommendations

 Recommend follow-up ultrasound examination in 4 weeks to
 reassess fetal growth and anatomy.

 questions or concerns.
                Pour Toi, Ayyouta
